# Patient Record
Sex: Female | Born: 1935 | Race: White | Hispanic: No | Marital: Married | Smoking: Former smoker
Health system: Southern US, Community
[De-identification: ages and names within clinical notes are randomized; demographics above are authoritative.]

## PROBLEM LIST (undated history)

## (undated) DIAGNOSIS — I10 Essential (primary) hypertension: Secondary | ICD-10-CM

## (undated) DIAGNOSIS — H269 Unspecified cataract: Secondary | ICD-10-CM

## (undated) DIAGNOSIS — M810 Age-related osteoporosis without current pathological fracture: Secondary | ICD-10-CM

## (undated) DIAGNOSIS — Z5189 Encounter for other specified aftercare: Secondary | ICD-10-CM

## (undated) DIAGNOSIS — E785 Hyperlipidemia, unspecified: Secondary | ICD-10-CM

## (undated) DIAGNOSIS — E039 Hypothyroidism, unspecified: Secondary | ICD-10-CM

## (undated) DIAGNOSIS — K635 Polyp of colon: Secondary | ICD-10-CM

## (undated) HISTORY — DX: Age-related osteoporosis without current pathological fracture: M81.0

## (undated) HISTORY — PX: CATARACT EXTRACTION: SUR2

## (undated) HISTORY — DX: Encounter for other specified aftercare: Z51.89

## (undated) HISTORY — DX: Hypothyroidism, unspecified: E03.9

## (undated) HISTORY — DX: Polyp of colon: K63.5

## (undated) HISTORY — PX: APPENDECTOMY: SHX54

## (undated) HISTORY — PX: TONSILLECTOMY AND ADENOIDECTOMY: SUR1326

## (undated) HISTORY — DX: Hyperlipidemia, unspecified: E78.5

## (undated) HISTORY — DX: Unspecified cataract: H26.9

## (undated) HISTORY — PX: ABDOMINAL HYSTERECTOMY: SHX81

---

## 2002-03-23 ENCOUNTER — Encounter: Admission: RE | Admit: 2002-03-23 | Discharge: 2002-03-23 | Payer: Self-pay | Admitting: Internal Medicine

## 2002-03-23 ENCOUNTER — Encounter: Payer: Self-pay | Admitting: Internal Medicine

## 2004-08-13 ENCOUNTER — Ambulatory Visit: Payer: Self-pay | Admitting: Internal Medicine

## 2004-10-09 ENCOUNTER — Encounter: Admission: RE | Admit: 2004-10-09 | Discharge: 2004-10-09 | Payer: Self-pay | Admitting: Internal Medicine

## 2005-09-25 ENCOUNTER — Ambulatory Visit: Payer: Self-pay | Admitting: Internal Medicine

## 2012-01-19 ENCOUNTER — Emergency Department (HOSPITAL_COMMUNITY): Payer: Medicare Other

## 2012-01-19 ENCOUNTER — Observation Stay (HOSPITAL_COMMUNITY)
Admission: EM | Admit: 2012-01-19 | Discharge: 2012-01-20 | Disposition: A | Discharge status: Home / Self Care | Payer: Medicare Other | Attending: Emergency Medicine | Admitting: Emergency Medicine

## 2012-01-19 ENCOUNTER — Other Ambulatory Visit: Payer: Self-pay

## 2012-01-19 ENCOUNTER — Encounter (HOSPITAL_COMMUNITY): Payer: Self-pay | Admitting: *Deleted

## 2012-01-19 DIAGNOSIS — R0602 Shortness of breath: Principal | ICD-10-CM

## 2012-01-19 DIAGNOSIS — I10 Essential (primary) hypertension: Secondary | ICD-10-CM | POA: Insufficient documentation

## 2012-01-19 HISTORY — DX: Essential (primary) hypertension: I10

## 2012-01-19 LAB — CBC
HCT: 41.5 % (ref 36.0–46.0)
Hemoglobin: 14 g/dL (ref 12.0–15.0)
MCV: 88.5 fL (ref 78.0–100.0)
Platelets: 356 10*3/uL (ref 150–400)
RBC: 4.69 MIL/uL (ref 3.87–5.11)
WBC: 8.5 10*3/uL (ref 4.0–10.5)

## 2012-01-19 LAB — CK TOTAL AND CKMB (NOT AT ARMC): Relative Index: INVALID (ref 0.0–2.5)

## 2012-01-19 LAB — PRO B NATRIURETIC PEPTIDE: Pro B Natriuretic peptide (BNP): 42.9 pg/mL (ref 0–450)

## 2012-01-19 LAB — COMPREHENSIVE METABOLIC PANEL
ALT: 12 U/L (ref 0–35)
Alkaline Phosphatase: 86 U/L (ref 39–117)
CO2: 26 mEq/L (ref 19–32)
Calcium: 10.3 mg/dL (ref 8.4–10.5)
GFR calc Af Amer: >90 mL/min (ref >90)
GFR calc non Af Amer: 80 mL/min — ABNORMAL LOW (ref >90)
Glucose, Bld: 101 mg/dL — ABNORMAL HIGH (ref 70–99)
Sodium: 138 mEq/L (ref 135–145)

## 2012-01-19 LAB — TROPONIN I: Troponin I: <0.3 ng/mL (ref <0.30)

## 2012-01-19 LAB — DIFFERENTIAL
Eosinophils Relative: 1 % (ref 0–5)
Lymphocytes Relative: 24 % (ref 12–46)
Lymphs Abs: 2.1 10*3/uL (ref 0.7–4.0)

## 2012-01-19 MED ORDER — SODIUM CHLORIDE 0.9 % IV SOLN
1000.0000 mL | Freq: Once | INTRAVENOUS | Status: AC
  Administered 2012-01-20: 1000 mL via INTRAVENOUS

## 2012-01-19 MED ORDER — SODIUM CHLORIDE 0.9 % IV SOLN: 1000.0000 mL | INTRAVENOUS | Status: DC

## 2012-01-19 NOTE — ED Notes (Signed)
Difficulty breathing for 3 months while she was traveling in [place].  She was seen earlier today  And had a chest xray.  She was also given a hhn earlier today.  No respiratory difficulty at the present time.  Productive cough  Light brown thick

## 2012-01-19 NOTE — ED Notes (Signed)
Patient Dr Krystal Hopkins [phone] son of patient.

## 2012-01-19 NOTE — ED Notes (Signed)
The pt has a son  That is a doctor and he insisted that she come here to be seen.  She had many of these same tests today earlier.  She is requesting a stress test.  She has an appoinment on Monday with her doctor she saw today

## 2012-01-19 NOTE — ED Provider Notes (Signed)
History     CSN: [id number]  Arrival date & time 01/19/12  1735   First MD Initiated Contact with Patient 01/19/12 2004      Chief Complaint  Patient presents with  . Shortness of Breath    (Consider location/radiation/quality/duration/timing/severity/associated sxs/prior treatment) HPI  78 year old female with history of hypertension presents with chief complaints of shortness of breath. Patient states for the past several months she has been having persistent chest congestion, with cough productive with white sputum. Shortness of breath has been worsening with exertion, and improves with rest. Chest congestion has been persistent without sneezing or coughing or runny nose. Patient denies fever, chills, nausea, vomiting, diarrhea, abdominal pain, dysuria. She denies chest pain. States she went to [place] during that time and was having trouble with dyspnea on exertion. States she gets out of breath, walking on stairs, and this is unusual for her. She returned back to [place] last week. States she has tried to followup with the primary care Dr. for further evaluation of her shortness of breath. She mentioned that it was and she has a chest x-ray of which were normal. She returns home, and discussed the results with her son who is a physician. As an urgent to come to the ER for further evaluation, considering that she has been on a recent long trip, and she has a family history of CHF. Patient denies hemoptysis, calf pain or leg swelling. She denies weight gain. Patient states she sleeps with one pillow at night and denies increased SOB with laying flat.    Past Medical History  Diagnosis Date  . Hypertension     History reviewed. No pertinent past surgical history.  No family history on file.  History  Substance Use Topics  . Smoking status: Never Smoker   . Smokeless tobacco: Not on file  . Alcohol Use: Yes    OB History    Grav Para Term Preterm Abortions TAB SAB Ect Mult Living               Review of Systems  All other systems reviewed and are negative.    Allergies  Fosamax  Home Medications   Current Outpatient Rx  Name Route Sig Dispense Refill  . ASPIRIN EC 81 MG PO TBEC Oral Take 81 mg by mouth every morning.    . ENALAPRIL MALEATE 10 MG PO TABS Oral Take 10 mg by mouth every morning.    Marland Kitchen LEVOTHYROXINE SODIUM 100 MCG PO TABS Oral Take 100 mcg by mouth every morning.    . ADULT MULTIVITAMIN W/MINERALS CH Oral Take 1 tablet by mouth every morning.    Marland Kitchen SIMVASTATIN 20 MG PO TABS Oral Take 20 mg by mouth at bedtime.      BP 144/55  Pulse 90  Temp(Src) 98.2 F (36.8 C) (Oral)  Resp 20  SpO2 98%  Physical Exam  Nursing note and vitals reviewed. Constitutional: She is oriented to person, place, and time. She appears well-developed and well-nourished. No distress.       Awake, alert, nontoxic appearance  HENT:  Head: Atraumatic.  Eyes: Conjunctivae are normal. Right eye exhibits no discharge. Left eye exhibits no discharge.  Neck: Neck supple.  Cardiovascular: Normal rate and regular rhythm.   Pulmonary/Chest: Effort normal. No respiratory distress. She has no wheezes. She has no rales. She exhibits no tenderness.  Abdominal: Soft. There is no tenderness. There is no rebound.  Musculoskeletal: Normal range of motion. She exhibits no edema and no tenderness.  Neurological: She is alert and oriented to person, place, and time.  Skin: Skin is warm. No rash noted.  Psychiatric: She has a normal mood and affect.    ED Course  Procedures (including critical care time)   Labs Reviewed  CBC  DIFFERENTIAL  POCT I-STAT TROPONIN I  COMPREHENSIVE METABOLIC PANEL  CK TOTAL AND CKMB   No results found.   No diagnosis found.   Date: 01/19/2012  Rate: 85   Rhythm: normal sinus rhythm  QRS Axis: left  Intervals: PR prolonged  ST/T Wave abnormalities: normal  Conduction Disutrbances:right bundle branch block  Narrative Interpretation:   Old  EKG Reviewed: none available  Results for orders placed during the hospital encounter of 01/19/12  CBC      Component Value Range   WBC 8.5  4.0 - 10.5 (K/uL)   RBC 4.69  3.87 - 5.11 (MIL/uL)   Hemoglobin 14.0  12.0 - 15.0 (g/dL)   HCT 16.1  09.6 - 04.5 (%)   MCV 88.5  78.0 - 100.0 (fL)   MCH 29.9  26.0 - 34.0 (pg)   MCHC 33.7  30.0 - 36.0 (g/dL)   RDW 40.9  81.1 - 91.4 (%)   Platelets 356  150 - 400 (K/uL)  DIFFERENTIAL      Component Value Range   Neutrophils Relative 66  43 - 77 (%)   Neutro Abs 5.6  1.7 - 7.7 (K/uL)   Lymphocytes Relative 24  12 - 46 (%)   Lymphs Abs 2.1  0.7 - 4.0 (K/uL)   Monocytes Relative 8  3 - 12 (%)   Monocytes Absolute 0.7  0.1 - 1.0 (K/uL)   Eosinophils Relative 1  0 - 5 (%)   Eosinophils Absolute 0.1  0.0 - 0.7 (K/uL)   Basophils Relative 0  0 - 1 (%)   Basophils Absolute 0.0  0.0 - 0.1 (K/uL)  COMPREHENSIVE METABOLIC PANEL      Component Value Range   Sodium 138  135 - 145 (mEq/L)   Potassium 4.5  3.5 - 5.1 (mEq/L)   Chloride 101  96 - 112 (mEq/L)   CO2 26  19 - 32 (mEq/L)   Glucose, Bld 101 (*) 70 - 99 (mg/dL)   BUN 17  6 - 23 (mg/dL)   Creatinine, Ser 7.82  0.50 - 1.10 (mg/dL)   Calcium 95.6  8.4 - 10.5 (mg/dL)   Total Protein 7.3  6.0 - 8.3 (g/dL)   Albumin 4.2  3.5 - 5.2 (g/dL)   AST 21  0 - 37 (U/L)   ALT 12  0 - 35 (U/L)   Alkaline Phosphatase 86  39 - 117 (U/L)   Total Bilirubin 0.4  0.3 - 1.2 (mg/dL)   GFR calc non Af Amer 80 (*) >90 (mL/min)   GFR calc Af Amer >90  >90 (mL/min)  CK TOTAL AND CKMB      Component Value Range   Total CK 74  7 - 177 (U/L)   CK, MB 2.7  0.3 - 4.0 (ng/mL)   Relative Index RELATIVE INDEX IS INVALID  0.0 - 2.5   POCT I-STAT TROPONIN I      Component Value Range   Troponin i, poc 0.01  0.00 - 0.08 (ng/mL)   Comment 3           D-DIMER, QUANTITATIVE      Component Value Range   D-Dimer, Quant 1.31 (*) 0.00 - 0.48 (ug/mL-FEU)  PRO B NATRIURETIC PEPTIDE  Component Value Range   Pro B  Natriuretic peptide (BNP) 42.9  0 - 450 (pg/mL)  TROPONIN I      Component Value Range   Troponin I <0.30  <0.30 (ng/mL)   Dg Chest 2 View  01/19/2012  *RADIOLOGY REPORT*  Clinical Data: Shortness of breath and congestion  CHEST - 2 VIEW  Comparison: None.  Findings: Heart size is normal. Aorta is ectatic and unfolded.  The lungs are clear.  Mildly diffusely prominent interstitial markings are noted without evidence for overt edema.  No pleural effusion. No acute osseous abnormality.  Minimal bilateral AC joint degenerative change.  IMPRESSION: No focal abnormality.  Minimally prominent interstitial markings is nonspecific.  Original Report Authenticated By: Harrel Lemon, M.D.   Ct Angio Chest W/cm &/or Wo Cm  01/19/2012  *RADIOLOGY REPORT*  Clinical Data: Recent long-distance flights; elevated D-dimer. History of pulmonary embolus.  Assess for pulmonary embolus.  CT ANGIOGRAPHY CHEST  Technique:  Multidetector CT imaging of the chest using the standard protocol during bolus administration of intravenous contrast. Multiplanar reconstructed images including MIPs were obtained and reviewed to evaluate the vascular anatomy.  Contrast:  85 mL of Omnipaque 350 IV contrast  Comparison: Chest radiograph performed earlier today at 08:04 p.m.  Findings: There is no evidence of significant pulmonary embolus.  There is a slightly mosaic pattern of lung attenuation bilaterally. Mild focal atelectasis at the right lung base reflects a focal herniation of fat across the right hemidiaphragm.  There is no evidence of significant focal consolidation, pleural effusion or pneumothorax.  No masses are identified; no abnormal focal contrast enhancement is seen.  The mediastinum is unremarkable in appearance.  No mediastinal lymphadenopathy is seen.  No pericardial effusion is identified. The great vessels are unremarkable in appearance.  No axillary lymphadenopathy is seen.  The thyroid gland is diminutive and unremarkable  in appearance.  The visualized portions of the liver and spleen are unremarkable. Multiple left renal parapelvic cysts are noted.  No acute osseous abnormalities are seen.  IMPRESSION:  1.  No evidence of significant pulmonary embolus. 2.  Slightly mosaic pattern of lung attenuation noted bilaterally; this is nonspecific, and can reflect a variety of etiologies. 3.  Mild focal atelectasis at the right lung base, reflecting a focal herniation of fat across the right hemidiaphragm. 4.  Multiple left renal parapelvic cysts noted.  Original Report Authenticated By: Tonia Ghent, M.D.      MDM  Patient is currently in no acute respiratory distress. She is stable normal vital signs. Lungs clear to auscultation bilaterally. She has no evidence of pedal edema or calf tenderness. However, she is at increased risk of having PE although the duration of her symptoms of the symptoms to support it.  I will obtain D-dimer and BNP for further evaluation.  Currently her CXR is unremarkable, along with her Hgb and electrolytes.  Normal first set of trop. ECG shows incomplete RBBB but no prior ECG for comparison.     10:09 PM Elevated D-dimer of 1.31.  Pt has normal kidney function.  Will obtain chest CTA to r/o PE.  Pt is aware of plan.      11:21 PM Normal delta troponin.  Chest CTA shows no evidence of PE. Pt's son who is a physician request for a cardiac stress test.  I discussed this with my attending, who agrees to see pt.  Plan to move to CDU for cardiac stress test in the AM.    5:46 AM  Plan for stress test in AM.  If negative, to f/u with PCP at [facility].  Pt has an uneventful night in ED and sleeping comfortably. Dr. Manus Gunning is aware of plan.    Fayrene Helper, PA-C 01/20/12 [phone]

## 2012-01-19 NOTE — ED Notes (Signed)
Patient with shortness of breath for last three months.  Patient just returned home from [place], was seen at PCP today and had full work up of shortness of breath with bloodwork.  Family member is MD and stressed for patient to come to ED to be evaluated for shortness of breath.  Patient with clear breath sounds, no distress noted.  Patient able to walk with no shortness of breath.  Patient is CAOx3.

## 2012-01-20 DIAGNOSIS — R072 Precordial pain: Secondary | ICD-10-CM

## 2012-01-20 LAB — POCT I-STAT TROPONIN I: Troponin i, poc: 0 ng/mL (ref 0.00–0.08)

## 2012-01-20 MED ORDER — SODIUM CHLORIDE 0.9 % IV SOLN: 20.0000 mL | INTRAVENOUS | Status: DC

## 2012-01-20 NOTE — ED Notes (Signed)
Pt education provided on stress test. Questions answered. Pt states she has had a cough all winter. States she does ride an exercise bike for 15 minutes daily but states she doesn't like to walk a lot because it makes her sob. Pt demonstrates coungh. Nonproductive. Has rattley cough. Pt verbalizing concern that she may have chf

## 2012-01-20 NOTE — Progress Notes (Deleted)
  Echocardiogram 2D Echocardiogram has been performed.  Krystal Hopkins L 01/20/2012, 11:09 AM

## 2012-01-20 NOTE — Discharge Instructions (Signed)
Shortness of Breath Shortness of breath (dyspnea) is the feeling of uneasy breathing. Shortness of breath does not always mean that there is a life-threatening illness. However, shortness of breath requires immediate medical care. CAUSES  Causes for shortness of breath include:  Not enough oxygen in the air (as with high altitudes or with a smoke-filled room).   Short-term (acute) lung disease, including:   Infections such as pneumonia.   Fluid in the lungs, such as heart failure.   A blood clot in the lungs (pulmonary embolism).   Lasting (chronic) lung diseases.   Heart disease (heart attack, angina, heart failure, and others).   Low red blood cells (anemia).   Poor physical fitness. This can cause shortness of breath when you exercise.   Chest or back injuries or stiffness.   Being overweight (obese).   Anxiety. This can make you feel like you are not getting enough air.  DIAGNOSIS  Serious medical problems can usually be found during your physical exam. Many tests may also be done to determine why you are having shortness of breath. Tests include:  Chest X-rays.   Lung function tests.   Blood tests.   Electrocardiography.   Exercise testing.   A cardiac echo.   Imaging scans.  Your caregiver may not be able to find a cause for your shortness of breath after your exam. In this case, it is important to have a follow-up exam with your caregiver as directed.  HOME CARE INSTRUCTIONS   Do not smoke. Smoking is a common cause of shortness of breath. Ask for help to stop smoking.   Avoid being around chemicals that may bother your breathing (paint fumes, dust).   Rest as needed. Slowly resume your usual activities.   If medicines were prescribed, take them as directed for the full length of time directed. This includes oxygen and any inhaled medicines.   Follow up with your caregiver as directed. Waiting to do so or failure to follow up could result in worsening of  your condition and possible disability or death.   Be sure you understand what to do or who to call if your shortness of breath worsens.  SEEK MEDICAL CARE IF:   Your condition does not improve in the time expected.   You have a hard time doing your normal activities even with rest.   You have any side effects or problems with the medicines prescribed.   You develop any new symptoms.  SEEK IMMEDIATE MEDICAL CARE IF:   Your shortness of breath is getting worse.   You feel lightheaded, faint, or develop a cough not controlled with medicines.   You start coughing up blood.   You have pain with breathing.   You have chest pain or pain in your arms, shoulders, or abdomen.   You have a fever.   You are unable to walk up stairs or exercise the way you normally do.   Your symptoms are getting worse.  Document Released: 06/16/2001 Document Revised: 09/10/2011 Document Reviewed: 02/01/2008 ExitCare Patient Information 2012 [employer]. 

## 2012-01-20 NOTE — Progress Notes (Signed)
  Echocardiogram Echocardiogram Stress Test has been performed.  Krystal Hopkins L 01/20/2012, 11:10 AM

## 2012-01-20 NOTE — ED Notes (Signed)
PT CONTNUES OUT OF DEPT IN [facility]

## 2012-01-20 NOTE — ED Provider Notes (Signed)
7:15 AM Assumed care of the patient in the CDU from Dr. Manus Gunning.  Patient is currently on chest pain protocol.  Plan is for patient to have Stress Echo this morning and if normal be discharged home with PCP follow up.  Patient had CTA last evening which was negative for PE. Patient is awaiting Stress Echocardiogram this morning.  Reassessed patient.  Patient reports that she is not having any chest pain or SOB at this time.  No complaints.  VSS.  Alert and orientated x 3, Heart RRR, Lungs CTAB.    8:30 Am Reassessed patient.  Discussed all of the results of the tests with the patient and her family.  Patient and family verbalize understanding.  Patient awaiting Stress Echo. No chest pain at this time.  No acute distress.  12:15 PM Reassessed patient.  Patient has now returned from Stress Echo.  She is denying any chest pain at this time.  No acute distress.  Alert and orientated, VSS, Heart RRR, Lungs CTAB.  12:45 PM Discussed results of the Echocardiogram with patient.  Patient with normal EF and normal Echocardiogram.  Will discharge patient home.  Patient instructed to follow up with PCP.  Pascal Lux Surry, PA-C 01/20/12 1552

## 2012-01-22 NOTE — ED Provider Notes (Signed)
Medical screening examination/treatment/procedure(s) were performed by non-physician practitioner and as supervising physician I was immediately available for consultation/collaboration.   Dione Booze, MD 01/22/12 [phone]

## 2012-01-22 NOTE — ED Provider Notes (Signed)
Medical screening examination/treatment/procedure(s) were performed by non-physician practitioner and as supervising physician I was immediately available for consultation/collaboration.   Martie Fulgham L Michoel Kunin, MD 01/22/12 1641 

## 2012-03-16 ENCOUNTER — Encounter: Payer: Self-pay | Admitting: Internal Medicine

## 2012-09-26 ENCOUNTER — Encounter: Payer: Self-pay | Admitting: Internal Medicine

## 2012-10-05 DIAGNOSIS — K635 Polyp of colon: Secondary | ICD-10-CM

## 2012-10-05 HISTORY — PX: COLONOSCOPY: SHX174

## 2012-10-05 HISTORY — DX: Polyp of colon: K63.5

## 2012-10-20 ENCOUNTER — Telehealth: Payer: Self-pay | Admitting: *Deleted

## 2012-10-20 NOTE — Telephone Encounter (Signed)
Dr. Leone Payor,  This pt has a PV 10-21-12 for a colonoscopy on 11-08-12.  According to the referral info sent she had occult blood in stool.  Do you want to see her first or go ahead with the colon? I'll put this information in your in box.  Thanks [employer]

## 2012-10-20 NOTE — Telephone Encounter (Signed)
Ok to go ahead

## 2012-10-24 ENCOUNTER — Ambulatory Visit (AMBULATORY_SURGERY_CENTER): Payer: Medicare Other | Admitting: *Deleted

## 2012-10-24 VITALS — Ht 63.0 in | Wt 223.2 lb

## 2012-10-24 DIAGNOSIS — R195 Other fecal abnormalities: Secondary | ICD-10-CM

## 2012-10-24 MED ORDER — NA SULFATE-K SULFATE-MG SULF 17.5-3.13-1.6 GM/177ML PO SOLN: 1.0000 | Freq: Once | ORAL | Status: DC

## 2012-10-24 NOTE — Progress Notes (Signed)
No allergy to eggs or soy products 

## 2012-10-26 ENCOUNTER — Other Ambulatory Visit: Payer: Self-pay | Admitting: Cardiology

## 2012-10-26 ENCOUNTER — Ambulatory Visit
Admission: RE | Admit: 2012-10-26 | Discharge: 2012-10-26 | Disposition: A | Discharge status: Home / Self Care | Payer: Medicare Other | Source: Ambulatory Visit | Attending: Cardiology | Admitting: Cardiology

## 2012-10-26 DIAGNOSIS — R0602 Shortness of breath: Secondary | ICD-10-CM

## 2012-11-08 ENCOUNTER — Encounter: Payer: Self-pay | Admitting: Internal Medicine

## 2012-11-08 ENCOUNTER — Ambulatory Visit (AMBULATORY_SURGERY_CENTER): Payer: Medicare Other | Admitting: Internal Medicine

## 2012-11-08 VITALS — BP 111/70 | HR 68 | Temp 97.2°F | Resp 12 | Ht 63.0 in | Wt 223.0 lb

## 2012-11-08 DIAGNOSIS — L309 Dermatitis, unspecified: Secondary | ICD-10-CM

## 2012-11-08 DIAGNOSIS — D126 Benign neoplasm of colon, unspecified: Secondary | ICD-10-CM

## 2012-11-08 DIAGNOSIS — K5730 Diverticulosis of large intestine without perforation or abscess without bleeding: Secondary | ICD-10-CM

## 2012-11-08 DIAGNOSIS — R195 Other fecal abnormalities: Secondary | ICD-10-CM

## 2012-11-08 DIAGNOSIS — K648 Other hemorrhoids: Secondary | ICD-10-CM

## 2012-11-08 MED ORDER — NYSTATIN-TRIAMCINOLONE 100000-0.1 UNIT/GM-% EX OINT: TOPICAL_OINTMENT | Freq: Two times a day (BID) | CUTANEOUS | Status: DC

## 2012-11-08 MED ORDER — SODIUM CHLORIDE 0.9 % IV SOLN: 500.0000 mL | INTRAVENOUS | Status: DC

## 2012-11-08 NOTE — Progress Notes (Signed)
No complaints noted in the recovery room. Maw  Patient did not experience any of the following events: a burn prior to discharge; a fall within the facility; wrong site/side/patient/procedure/implant event; or a hospital transfer or hospital admission upon discharge from the facility. (G8907) Patient did not have preoperative order for IV antibiotic SSI prophylaxis. (G8918)  

## 2012-11-08 NOTE — Op Note (Signed)
[facility] [address] [place], [address]   COLONOSCOPY PROCEDURE REPORT  PATIENT: Krystal Hopkins, Krystal Hopkins  MR#: [id number] BIRTHDATE: 06-10-1932 , 80  yrs. old GENDER: Female ENDOSCOPIST: Iva Boop, MD, [facility] REFERRED BY:   Zoe Lan, FNP PROCEDURE DATE:  11/08/2012 PROCEDURE:   Colonoscopy with biopsy and snare polypectomy ASA CLASS:   Class II INDICATIONS:heme-positive stool. MEDICATIONS: propofol (Diprivan) 200mg  IV, MAC sedation, administered by CRNA, and These medications were titrated to patient response per physician's verbal order  DESCRIPTION OF PROCEDURE:   After the risks benefits and alternatives of the procedure were thoroughly explained, informed consent was obtained.  A digital rectal exam revealed no abnormalities of the rectum.   The LB [id number]  endoscope was introduced through the anus and advanced to the cecum, which was identified by both the appendix and ileocecal valve. No adverse events experienced.   The quality of the prep was Suprep excellent The instrument was then slowly withdrawn as the colon was fully examined.      COLON FINDINGS: Two diminutive polypoid shaped sessile polyps were found in the transverse colon.  A polypectomy was performed with a cold snare on the 4 mm polyp and with cold forceps on the 2 mm polyp. The resection was complete and the polyp tissue was completely retrieved.   Moderate diverticulosis was noted in the sigmoid colon.   Small internal hemorrhoids were found.   A right colon retroflexion was performed.  Retroflexed views revealed internal hemorrhoids. The time to cecum=3 minutes 08 seconds. Withdrawal time=12 minutes 02 seconds.  The scope was withdrawn and the procedure completed. COMPLICATIONS: There were no complications.  ENDOSCOPIC IMPRESSION: 1.   Two diminutive sessile polyps were found in the transverse colon; polypectomy was performed with a cold snare and with  cold forceps 2.   Moderate diverticulosis was noted in the sigmoid colon 3.   Small internal hemorrhoids   RECOMMENDATIONS: follow-up as needed most likely Nystatin-triamcinolone for dermatitis  eSigned:  Iva Boop, MD, [facility] 11/08/2012 10:15 AM   cc: The Patient    and Zoe Lan, FNP (with Dr. Everlene Other)

## 2012-11-08 NOTE — Patient Instructions (Addendum)
Two tiny polyps were removed. They look benign but I will find out and let you know.  You also have diverticulosis and internal hemorrhoids (these probably caused the + test for blood in the stool). Neither of these are usually a significant problem.  I do not think I will be recommending a routine repeat colonoscopy but will send a letter to let you know.  I saw a rash on your skin of the buttocks and perianal region and have prescribed an ointment that should fix that.  After it is gone using anti-fungal powder should help keep the rash away   Thank you for choosing me and [facility].  Iva Boop, MD, [facility]     Handouts were given to your care partner on polyps, diverticulosis and hemorrhoids.  You may resume your current medications today.  Please call if any questions or concerns.    YOU HAD AN ENDOSCOPIC PROCEDURE TODAY AT THE [facility]: Refer to the procedure report that was given to you for any specific questions about what was found during the examination.  If the procedure report does not answer your questions, please call your gastroenterologist to clarify.  If you requested that your care partner not be given the details of your procedure findings, then the procedure report has been included in a sealed envelope for you to review at your convenience later.  YOU SHOULD EXPECT: Some feelings of bloating in the abdomen. Passage of more gas than usual.  Walking can help get rid of the air that was put into your GI tract during the procedure and reduce the bloating. If you had a lower endoscopy (such as a colonoscopy or flexible sigmoidoscopy) you may notice spotting of blood in your stool or on the toilet paper. If you underwent a bowel prep for your procedure, then you may not have a normal bowel movement for a few days.  DIET: Your first meal following the procedure should be a light meal and then it is ok to progress to your normal diet.  A  half-sandwich or bowl of soup is an example of a good first meal.  Heavy or fried foods are harder to digest and may make you feel nauseous or bloated.  Likewise meals heavy in dairy and vegetables can cause extra gas to form and this can also increase the bloating.  Drink plenty of fluids but you should avoid alcoholic beverages for 24 hours.  ACTIVITY: Your care partner should take you home directly after the procedure.  You should plan to take it easy, moving slowly for the rest of the day.  You can resume normal activity the day after the procedure however you should NOT DRIVE or use heavy machinery for 24 hours (because of the sedation medicines used during the test).    SYMPTOMS TO REPORT IMMEDIATELY: A gastroenterologist can be reached at any hour.  During normal business hours, 8:30 AM to 5:00 PM Monday through Friday, call [phone].  After hours and on weekends, please call the GI answering service at [phone] who will take a message and have the physician on call contact you.   Following lower endoscopy (colonoscopy or flexible sigmoidoscopy):  Excessive amounts of blood in the stool  Significant tenderness or worsening of abdominal pains  Swelling of the abdomen that is new, acute  Fever of 100F or higher    FOLLOW UP: If any biopsies were taken you will be contacted by phone or by letter within the  next 1-3 weeks.  Call your gastroenterologist if you have not heard about the biopsies in 3 weeks.  Our staff will call the home number listed on your records the next business day following your procedure to check on you and address any questions or concerns that you may have at that time regarding the information given to you following your procedure. This is a courtesy call and so if there is no answer at the home number and we have not heard from you through the emergency physician on call, we will assume that you have returned to your regular daily activities without  incident.  SIGNATURES/CONFIDENTIALITY: You and/or your care partner have signed paperwork which will be entered into your electronic medical record.  These signatures attest to the fact that that the information above on your After Visit Summary has been reviewed and is understood.  Full responsibility of the confidentiality of this discharge information lies with you and/or your care-partner.

## 2012-11-08 NOTE — Progress Notes (Signed)
Called to room to assist during endoscopic procedure.  Patient ID and intended procedure confirmed with present staff. Received instructions for my participation in the procedure from the performing physician.  

## 2012-11-09 ENCOUNTER — Telehealth: Payer: Self-pay | Admitting: *Deleted

## 2012-11-09 NOTE — Telephone Encounter (Signed)
  Follow up Call-  Call back number 11/08/2012  Post procedure Call Back phone  # [phone]  Permission to leave phone message Yes     Patient questions:  Do you have a fever, pain , or abdominal swelling? no Pain Score  0 *  Have you tolerated food without any problems? yes  Have you been able to return to your normal activities? yes  Do you have any questions about your discharge instructions: Diet   no Medications  no Follow up visit  no  Do you have questions or concerns about your Care? no  Actions: * If pain score is 4 or above: No action needed, pain <4.

## 2012-11-14 ENCOUNTER — Encounter: Payer: Self-pay | Admitting: Internal Medicine

## 2012-12-06 ENCOUNTER — Institutional Professional Consult (permissible substitution): Payer: Medicare Other | Admitting: Internal Medicine

## 2012-12-27 ENCOUNTER — Ambulatory Visit (INDEPENDENT_AMBULATORY_CARE_PROVIDER_SITE_OTHER): Payer: Medicare Other | Admitting: Internal Medicine

## 2012-12-27 ENCOUNTER — Encounter: Payer: Self-pay | Admitting: Internal Medicine

## 2012-12-27 VITALS — BP 104/62 | HR 69 | Temp 98.1°F | Ht 64.0 in | Wt 228.2 lb

## 2012-12-27 DIAGNOSIS — R059 Cough, unspecified: Secondary | ICD-10-CM

## 2012-12-27 DIAGNOSIS — R0600 Dyspnea, unspecified: Secondary | ICD-10-CM

## 2012-12-27 DIAGNOSIS — R05 Cough: Secondary | ICD-10-CM

## 2012-12-27 DIAGNOSIS — R0609 Other forms of dyspnea: Secondary | ICD-10-CM

## 2012-12-27 DIAGNOSIS — J849 Interstitial pulmonary disease, unspecified: Secondary | ICD-10-CM

## 2012-12-27 NOTE — Progress Notes (Signed)
Subjective:    Patient ID: Krystal Hopkins, female    DOB: 1932/09/27, 79 y.o.   MRN: [id number] pcp is Iona Hansen, NP  HPI REferred by Dr Jeanella Cara for dyspnea  IOV 12/27/2012  79 year old female. Reports dyspnea. Says "I cannot catch my breath". Every spring she goes to [place] with daughter but after last spring visit, developed symptoms. Last spring she was in [place] and [place]: and noticed exertional dyspnea with hill walking. AFter that dyspneic is episodic and is unrelated to exertion.Says dyspnea is episodic. Episodes have happened only 4 times (once after seeing cardiology). Definitely unrelated to exertion. Episodes occur suddenly without warning. Of these 4, 2 happened in Dr Zoe Lan office and in both cough preceded dyspnea. Other 2 times happened again in day time. All without warning. She demonstrates to me a choking kind of whoop that is making her dyspneic. Lasts several seconds.   Otherwise, denies wheeze, Patient reports there is concern for ILD on CXR and therefore referred here. Patient wonders if her obesity (BMI almost 51) and age is making her dyspneic but wonders why this is not related to exertion. Exam shows crackles at the base. Walking desaturation test 185 feet x3 laps did not desaturate    Dyspnea relevant history  - Former 20 pack smoker. Quit 1972  - Imaging : 01/19/2012: CT angiogram of the chest was negative for pulmonary embolism but showed monocytes at an ablation of the chest with right lower lobe focal opacity was subtle and was thought to represent a fat herniation to the right hemidiaphragm. 10/26/2012: Chest x-ray showed stable lung findings with some bronchitis changes  - Near morbid obesity : Body mass index is 39.15 kg/(m^2). - obesity  - Echocardiogram 11/02/2012: According to the outside report. She had normal systolic global function with an ejection fraction of 56%. The left atrial cavity was slightly dilated and she had trace mitral and  tricuspid regurgitation with borderline elevated pulmonary artery systolic pressure.  - Nuclear medicine stress test 10/28/2012: EKG showed poor R-wave progression but stress EKG was nondiagnostic for ischemia. Dynamic gaited images revealed normal wall motion. The left ventricle ejection fraction was 71%. This was considered a low-risk study   Chronic cough  She reports mild cough x 1 year. Describes it as just  irritating. Is so mild that she would not have come here for opinion. Not getting worse; stable. Dry cough. All day. Does not wake her up but worst when she first gets up; has to clear throat a lot. DEnies associated sinus draingae, gerd, Rates cough as a level 2 of 10.    Dr Gretta Cool Reflux Symptom Index (> 13-15 suggestive of LPR cough) 0 -> 5  =  none ->severe problem 12/27/2012   Hoarseness of problem with voice 1  Clearing  Of Throat 3  Excess throat mucus or feeling of post nasal drip 2  Difficulty swallowing food, liquid or tablets 1  Cough after eating or lying down 0  Breathing difficulties or choking episodes 0  Troublesome or annoying cough 2  Sensation of something sticking in throat or lump in throat 3  Heartburn, chest pain, indigestion, or stomach acid coming up 0  TOTAL 12     No reportss  Of orthopnea, gerd, hemoptysis, edema (other than baseline mild), paroxysmal nocturnal dyspnea. QRS and a year ago look for blood clot in the lung that      Past Medical History  Diagnosis Date  . Hypertension   .  Blood transfusion without reported diagnosis   . Cataract   . Hyperlipidemia   . Osteoporosis   . Thyroid disease     hypo     Family History  Problem Relation Age of Onset  . Colon cancer Neg Hx   . Esophageal cancer Neg Hx   . Rectal cancer Neg Hx   . Stomach cancer Neg Hx   . Heart disease Father   . Heart disease Mother   . Heart disease      siblings     History   Social History  . Marital Status: Married    Spouse Name: N/A     Number of Children: N/A  . Years of Education: N/A   Occupational History  . Not on file.   Social History Main Topics  . Smoking status: Former Smoker -- 1.00 packs/day for 20 years    Types: Cigarettes    Quit date: 10/24/1970  . Smokeless tobacco: Not on file  . Alcohol Use: Yes     Comment: socially  . Drug Use: No  . Sexually Active: Not on file   Other Topics Concern  . Not on file   Social History Narrative  . No narrative on file     Allergies  Allergen Reactions  . Fosamax (Alendronate Sodium) Other (See Comments)    "muscle aches"     Outpatient Prescriptions Prior to Visit  Medication Sig Dispense Refill  . aspirin EC 81 MG tablet Take 81 mg by mouth every morning.      Marland Kitchen BENICAR HCT 20-12.5 MG per tablet Take 1 tablet by mouth daily.       Marland Kitchen levothyroxine (SYNTHROID, LEVOTHROID) 100 MCG tablet Take 100 mcg by mouth every morning.      . Multiple Vitamin (MULITIVITAMIN WITH MINERALS) TABS Take 1 tablet by mouth every morning.      . nystatin-triamcinolone ointment (MYCOLOG) Apply topically 2 (two) times daily.  30 g  1  . pravastatin (PRAVACHOL) 20 MG tablet Take 20 mg by mouth daily.       Marland Kitchen olmesartan (BENICAR) 20 MG tablet Take 20 mg by mouth daily.      . simvastatin (ZOCOR) 20 MG tablet Take 20 mg by mouth at bedtime.       No facility-administered medications prior to visit.     Review of Systems  Constitutional: Negative for fever and unexpected weight change.  HENT: Negative for ear pain, nosebleeds, congestion, sore throat, rhinorrhea, sneezing, trouble swallowing, dental problem, postnasal drip and sinus pressure.   Eyes: Negative for redness and itching.  Respiratory: Positive for cough and shortness of breath. Negative for chest tightness and wheezing.   Cardiovascular: Negative for palpitations and leg swelling.  Gastrointestinal: Negative for nausea and vomiting.  Genitourinary: Negative for dysuria.  Musculoskeletal: Negative for joint  swelling.  Skin: Negative for rash.  Neurological: Negative for headaches.  Hematological: Does not bruise/bleed easily.  Psychiatric/Behavioral: Negative for dysphoric mood. The patient is not nervous/anxious.        Objective:   Physical Exam  Vitals reviewed. Constitutional: She is oriented to person, place, and time. She appears well-developed and well-nourished. No distress.  HENT:  Head: Normocephalic and atraumatic.  Right Ear: External ear normal.  Left Ear: External ear normal.  Mouth/Throat: Oropharynx is clear and moist. No oropharyngeal exudate.  Mild post nasal drainage +  Eyes: Conjunctivae and EOM are normal. Pupils are equal, round, and reactive to light. Right eye exhibits no discharge.  Left eye exhibits no discharge. No scleral icterus.  Neck: Normal range of motion. Neck supple. No JVD present. No tracheal deviation present. No thyromegaly present.  Cardiovascular: Normal rate, regular rhythm, normal heart sounds and intact distal pulses.  Exam reveals no gallop and no friction rub.   No murmur heard. Pulmonary/Chest: Effort normal. No respiratory distress. She has no wheezes. She has rales. She exhibits no tenderness.  Rt base worse than left base but present bilaterally  Abdominal: Soft. Bowel sounds are normal. She exhibits no distension and no mass. There is no tenderness. There is no rebound and no guarding.  Musculoskeletal: Normal range of motion. She exhibits no edema and no tenderness.  Lymphadenopathy:    She has no cervical adenopathy.  Neurological: She is alert and oriented to person, place, and time. She has normal reflexes. No cranial nerve deficit. She exhibits normal muscle tone. Coordination normal.  Mild antalgic gait  Skin: Skin is warm and dry. No rash noted. She is not diaphoretic. No erythema. No pallor.  Psychiatric: She has a normal mood and affect. Her behavior is normal. Judgment and thought content normal.          Assessment &  Plan:

## 2012-12-27 NOTE — Patient Instructions (Addendum)
I am not completely sure what is going on Please have CT scan of the chest to look for interstitial lung disease Please have pulmonary function test Return to see me after the above Please complete all the above in the next few weeks

## 2012-12-30 ENCOUNTER — Telehealth: Payer: Self-pay | Admitting: Internal Medicine

## 2012-12-30 ENCOUNTER — Ambulatory Visit (INDEPENDENT_AMBULATORY_CARE_PROVIDER_SITE_OTHER)
Admission: RE | Admit: 2012-12-30 | Discharge: 2012-12-30 | Disposition: A | Discharge status: Home / Self Care | Payer: Medicare Other | Source: Ambulatory Visit | Attending: Internal Medicine | Admitting: Internal Medicine

## 2012-12-30 DIAGNOSIS — R059 Cough, unspecified: Secondary | ICD-10-CM | POA: Insufficient documentation

## 2012-12-30 DIAGNOSIS — R0600 Dyspnea, unspecified: Secondary | ICD-10-CM

## 2012-12-30 DIAGNOSIS — R05 Cough: Secondary | ICD-10-CM | POA: Insufficient documentation

## 2012-12-30 DIAGNOSIS — J8410 Pulmonary fibrosis, unspecified: Secondary | ICD-10-CM

## 2012-12-30 DIAGNOSIS — R0609 Other forms of dyspnea: Secondary | ICD-10-CM

## 2012-12-30 DIAGNOSIS — J849 Interstitial pulmonary disease, unspecified: Secondary | ICD-10-CM

## 2012-12-30 DIAGNOSIS — R0989 Other specified symptoms and signs involving the circulatory and respiratory systems: Secondary | ICD-10-CM

## 2012-12-30 NOTE — Telephone Encounter (Signed)
Triage,  Please call ct by 11am 12/30/2012. Ensure is CT chest ILD protocol withut contrast. I also need prone and supine images. And, I prefer read is by DR Entrikin or Dr Fredirick Lathe  Thanks  MR

## 2012-12-30 NOTE — Assessment & Plan Note (Signed)
?   ILD related, ? Sinus drainge related. Get pft and ct chest and then decide

## 2012-12-30 NOTE — Telephone Encounter (Signed)
Called, spoke with [facility] with [facility]. Informed her of below. Per Rose, order is in for CT Chest ILD protocol without contrast. She will inform tech that MR would also like prone and supine images. She will make note in comments section that MR would like Dr. Llana Aliment or Dr. Fredirick Lathe to read but states this may not happen.  Per Rose, there is a chance someone else will pick it up to read.

## 2012-12-30 NOTE — Assessment & Plan Note (Signed)
Dyspnea, cough, crackles could all be ILD related but dyspnea is non-exertional and you can also have crack;es from obesity and atelectasis with cough due to sinus drainage. Not sure what is going on. Best to get PFT and CT chest ILD protocol and assess. IF these are negatvife, will have to consider CPST

## 2013-01-01 ENCOUNTER — Telehealth: Payer: Self-pay | Admitting: Internal Medicine

## 2013-01-01 NOTE — Telephone Encounter (Signed)
Ct chest is normal. Please let her know. However, I want her to have the PFTs earlier like next week any location. I will review that and if normal, might have her do CPST. Her current fu of PFT and ov is on 02/01/13 is too far out   Ct Chest Wo Contrast  12/30/2012  *RADIOLOGY REPORT*  Clinical Data: Cough, shortness of breath, interstitial lung disease.  CT CHEST WITHOUT CONTRAST  Technique:  Multidetector CT imaging of the chest was performed following the standard protocol without IV contrast.  Comparison: 01/19/2012.  Findings: No pathologically enlarged mediastinal, hilar or axillary lymph nodes.  Coronary artery calcification.  Heart size normal. No pericardial effusion.  Minimal scarring is seen in the right lower lobe, adjacent to a small Bochdalek hernia.  High resolution and prone images show no subpleural reticulation, traction bronchiectasis/bronchiolectasis, architectural distortion or honeycombing.  Lungs are otherwise clear.  No pleural fluid.  Airway is unremarkable.  Incidental imaging of the upper abdomen shows no acute findings. No worrisome lytic or sclerotic lesions.  Degenerative changes are seen in the spine.  IMPRESSION: No evidence of interstitial lung disease.  No findings to explain the patient's cough and shortness of breath.   Original Report Authenticated By: Leanna Battles, M.D.

## 2013-01-05 NOTE — Telephone Encounter (Signed)
Pt aware of CT results. I have LMTCBx1 with Marcelino Duster at [facility] to schedule PFT. Pt states she cannot do PFT this week but anytime next week will work for her. I have cancelled PFT for 02-01-13. Carron Curie, CMA

## 2013-01-05 NOTE — Telephone Encounter (Signed)
I spoke with Marcelino Duster and scheduled the pt PFT for Tuesday 01-10-13 at 10am at [facility]. Pt is aware. Carron Curie, CMA

## 2013-01-10 ENCOUNTER — Ambulatory Visit (HOSPITAL_COMMUNITY)
Admission: RE | Admit: 2013-01-10 | Discharge: 2013-01-10 | Disposition: A | Discharge status: Home / Self Care | Payer: Medicare Other | Source: Ambulatory Visit | Attending: Internal Medicine | Admitting: Internal Medicine

## 2013-01-10 DIAGNOSIS — R059 Cough, unspecified: Secondary | ICD-10-CM

## 2013-01-10 DIAGNOSIS — R05 Cough: Secondary | ICD-10-CM | POA: Insufficient documentation

## 2013-01-10 DIAGNOSIS — R0600 Dyspnea, unspecified: Secondary | ICD-10-CM

## 2013-01-10 DIAGNOSIS — J849 Interstitial pulmonary disease, unspecified: Secondary | ICD-10-CM

## 2013-01-10 DIAGNOSIS — J8410 Pulmonary fibrosis, unspecified: Secondary | ICD-10-CM | POA: Insufficient documentation

## 2013-01-10 DIAGNOSIS — R0989 Other specified symptoms and signs involving the circulatory and respiratory systems: Secondary | ICD-10-CM | POA: Insufficient documentation

## 2013-01-10 DIAGNOSIS — R0609 Other forms of dyspnea: Secondary | ICD-10-CM | POA: Insufficient documentation

## 2013-01-10 LAB — PULMONARY FUNCTION TEST

## 2013-01-10 MED ORDER — ALBUTEROL SULFATE (5 MG/ML) 0.5% IN NEBU
2.5000 mg | INHALATION_SOLUTION | Freq: Once | RESPIRATORY_TRACT | Status: AC
  Administered 2013-01-10: 2.5 mg via RESPIRATORY_TRACT

## 2013-01-15 ENCOUNTER — Telehealth: Payer: Self-pay | Admitting: Internal Medicine

## 2013-01-15 DIAGNOSIS — R0600 Dyspnea, unspecified: Secondary | ICD-10-CM

## 2013-01-15 NOTE — Telephone Encounter (Signed)
Pulmonary function test 01/10/2013 suggest possibility of asthma but not clear-cut. In fact no clear-cut signal as to why short of breath. She should proceed with cardiopulmonary stress test prior to next visit. Please let to not test result , order CPST with exercise challenge bronchospasm and schedule office visit after CPE ST   Thanks  Dr. Kalman Shan, M.D., [facility].C.P Pulmonary and Critical Care Medicine Staff Physician [facility] Pager: [phone], If no answer or between  15:00h - 7:00h: call [phone]  01/15/2013 2:54 PM     Function test 01/10/2013: FVC 1.5 L/64%. FEV1 1.1 L/64% FEV1 22% bronchodilator response. Ratio is normal at 74/100%. Small airways 0.9 L/60% and normal. Spirometry suggests restriction but total lung capacity is 4.3 L/90% and normal. DLCO is 18.5/85% and normal

## 2013-01-18 NOTE — Telephone Encounter (Signed)
LMTCBx1. Order has not been placed yet. Waiting to speak to pt. Carron Curie, CMA

## 2013-01-19 NOTE — Telephone Encounter (Signed)
Called spoke with patient  Advised PFT results / recs as stated by MR below Pt okay with these recs and verbalized her understanding Pt already has ov w/ MR scheduled for 4.30.14 Will see if CPST can be scheduled prior to this appt; if not, will push ov out Order placed Will sign and forward to MR as [facility]

## 2013-01-19 NOTE — Telephone Encounter (Signed)
Pt returned Jennifer's call & can be reached at [phone].

## 2013-01-26 ENCOUNTER — Ambulatory Visit (HOSPITAL_COMMUNITY): Payer: Medicare Other | Attending: Internal Medicine

## 2013-01-26 DIAGNOSIS — R0602 Shortness of breath: Secondary | ICD-10-CM | POA: Insufficient documentation

## 2013-01-26 DIAGNOSIS — R0600 Dyspnea, unspecified: Secondary | ICD-10-CM

## 2013-02-01 ENCOUNTER — Ambulatory Visit: Payer: Medicare Other | Admitting: Internal Medicine

## 2013-02-10 DIAGNOSIS — R0602 Shortness of breath: Secondary | ICD-10-CM

## 2013-02-13 ENCOUNTER — Ambulatory Visit (INDEPENDENT_AMBULATORY_CARE_PROVIDER_SITE_OTHER): Payer: Medicare Other | Admitting: Internal Medicine

## 2013-02-13 ENCOUNTER — Encounter: Payer: Self-pay | Admitting: Internal Medicine

## 2013-02-13 VITALS — BP 108/68 | HR 95 | Temp 98.1°F | Ht 68.0 in | Wt 277.8 lb

## 2013-02-13 DIAGNOSIS — I519 Heart disease, unspecified: Secondary | ICD-10-CM

## 2013-02-13 DIAGNOSIS — R0989 Other specified symptoms and signs involving the circulatory and respiratory systems: Secondary | ICD-10-CM

## 2013-02-13 DIAGNOSIS — R0600 Dyspnea, unspecified: Secondary | ICD-10-CM

## 2013-02-13 DIAGNOSIS — I5189 Other ill-defined heart diseases: Secondary | ICD-10-CM

## 2013-02-13 DIAGNOSIS — R0609 Other forms of dyspnea: Secondary | ICD-10-CM

## 2013-02-13 NOTE — Patient Instructions (Addendum)
Your shortness of breath is due to weight and also due to effects of weight and bp on your heart called diastolic dysfunction  In order to get better  - start pulmonary rehabilitation  - lose weight  Followup   - 2months to discuss weight loss   To lose weight  #WEight Management    - we discussed extensively about weight management   - follow low glycemic diet plan that I outlined for you after extensive discussion. Do not follow other plans  - General  - drink lot of water  - avoid all moderate and high glycemic foods especially bad fruits, breads, pastas, fried foods, battered foods, sugary foods  - make non-starchy vegetables your base in terms of volume you eat  - always make sure you balance good carbs, good protein and good fat source  - good carbs are non-starchy vegetables, uncanned beans in the left column and low glycemic fruits in the left colum  - good protein source is egg white, beans, tofu, fish, chicken breast, fish, [place] and bison. Remember meat has to be skinless  - good healthy fat source is nuts, and fish   - focusing on eating right healthy foods (left lane) and avoiding unhealthy foods (middle and right lane) is better way to lose weight than to go hypo-caloric  - focus on staying full by eating right  - having a daily and weekly plan for what you will eat and where you will eat depending on your work, social life schedule is very important   - watch out for misleading labels on grocery aisle: High Fiber and Low Fat labeled foods generally are high in bad carbs or sugar  - measure weight once  a week  - discipline and attitude is key. Do not care for anyone else's opinion or feelings. Only yours matters   - For breakfast  - most important meal of the day. So, eat daily breakfast. Do not skip   - recommend 1/2 to 1 cup steel cut oat meal or 1/2 to 1 cup fiber one 60 cal   Or  1 to 1.5 cups Kashi go-lean with non-fat plain milk or 60 Cal Silk Soy mild. Can add  Berries. Can have egg at same time for breakfast  - For snacks  - recommend total 2-3 snacks per day  - snack should be light and filling  - best times are between breakfast and lunch, lunch and dinner and sometimes post-dinner snack  - Nut are great snacks. Stick to low glycemic nuts (less than 50gm per day) and eat only the nuts in the left lane like peanuts, pista, almond, walnut  - Low glycemic fruits are great snacks. Have 1-2 servings each day of fruits from the left lane   - If you like yogurt or cottage cheese - recommend Oikos or Fage 0% greek yogourt or Plan non-fat yogurt or [place]stone non-fat cottage cheese. Theyse have the least sugar. Do no exceed 100-200 gram per day. Fruit yogurts are the worst  - For Lunch and dinner  - unlimited non-starchy vegetable (prefer raw fresh or roasted or grilled) with skinless chicken or fish  - Special Notes  - Nuts: Nut are great snacks and have heart benefits. Stick to low glycemic nuts (less than 50gm per day) and eat only the nuts in the left lane like peanuts, pista, almond, walnuts  -  Ok to eat above nuts daily but only < 50gm/day  - If you eat more  than 50gm/day then you run risk of eating too many calories or saturated fat  - AVoid nuts glazed with sugar. Nuts have to be in salted/original form or roasted   - Fruits: Eat 1-2 fresh fruit servings daily but fruits can be dangerous because of high sugar content. So, choose your fruits wisely. Eat only the low glycemic fruits (left lane). Eat them fresh.  Do not eat them canned  - Avoid all fresh juices except if you use the low glycemic fruits and make them yourself without adding extra sugar   - Dairy: Is optional. Eat zero fat or low fat, fruit free yogurts or cottage cheese but not more than 100-200g per day  - Restaurant  - all restaurants have bad and good choices. Even fast food restaurants offer you good choices  - at restaurants do no fall prey to social pressure.. One way to eat  healthy at restaurant is to eat healthy snack or light healthy meal before you go to restaurant so that will prevent your cravings  - Restaurants with worst choices: [place] (except [employer], or [employer]), [place], [place]. At these restaurants avoid the bread, curry, fried and battered foods and chips  - Restaurants with best choices: greek, [place], [place], [place] (again here avoid bread, deep fried stuffed and pasta)  - Restaurants with Ok choice: McDonald's, [employer], [employer] (again here avoid the bread, fried stuff, fried meat)  - Always ask for grilled meat or vegetables, and fresh salad choices (get your salad dressing as low fat and to the side)

## 2013-02-13 NOTE — Progress Notes (Signed)
Subjective:    Patient ID: Krystal Hopkins, female    DOB: Nov 09, 1931, 79 y.o.   MRN: [id number] PCP Iona Hansen, NP  HPI REferred by Dr Jeanella Cara for dyspnea  IOV 12/27/2012  79 year old female. Reports dyspnea. Says "I cannot catch my breath". Every spring she goes to [place] with daughter but after last spring visit, developed symptoms. Last spring she was in [place] and [place]: and noticed exertional dyspnea with hill walking. AFter that dyspneic is episodic and is unrelated to exertion.Says dyspnea is episodic. Episodes have happened only 4 times (once after seeing cardiology). Definitely unrelated to exertion. Episodes occur suddenly without warning. Of these 4, 2 happened in Dr Zoe Lan office and in both cough preceded dyspnea. Other 2 times happened again in day time. All without warning. She demonstrates to me a choking kind of whoop that is making her dyspneic. Lasts several seconds.   Otherwise, denies wheeze, Patient reports there is concern for ILD on CXR and therefore referred here. Patient wonders if her obesity (BMI almost 20) and age is making her dyspneic but wonders why this is not related to exertion. Exam shows crackles at the base. Walking desaturation test 185 feet x3 laps did not desaturate    Dyspnea relevant history  - Former 20 pack smoker. Quit 1972  - Imaging : 01/19/2012: CT angiogram of the chest was negative for pulmonary embolism but showed monocytes at an ablation of the chest with right lower lobe focal opacity was subtle and was thought to represent a fat herniation to the right hemidiaphragm. 10/26/2012: Chest x-ray showed stable lung findings with some bronchitis changes  - Near morbid obesity : Body mass index is 39.15 kg/(m^2). - obesity  - Echocardiogram 11/02/2012: According to the outside report. She had normal systolic global function with an ejection fraction of 56%. The left atrial cavity was slightly dilated and she had trace mitral and  tricuspid regurgitation with borderline elevated pulmonary artery systolic pressure.  - Nuclear medicine stress test 10/28/2012: EKG showed poor R-wave progression but stress EKG was nondiagnostic for ischemia. Dynamic gaited images revealed normal wall motion. The left ventricle ejection fraction was 71%. This was considered a low-risk study   Chronic cough  She reports mild cough x 1 year. Describes it as just  irritating. Is so mild that she would not have come here for opinion. Not getting worse; stable. Dry cough. All day. Does not wake her up but worst when she first gets up; has to clear throat a lot. DEnies associated sinus draingae, gerd, Rates cough as a level 2 of 10.    Dr Gretta Cool Reflux Symptom Index (> 13-15 suggestive of LPR cough) 0 -> 5  =  none ->severe problem 12/27/2012   Hoarseness of problem with voice 1  Clearing  Of Throat 3  Excess throat mucus or feeling of post nasal drip 2  Difficulty swallowing food, liquid or tablets 1  Cough after eating or lying down 0  Breathing difficulties or choking episodes 0  Troublesome or annoying cough 2  Sensation of something sticking in throat or lump in throat 3  Heartburn, chest pain, indigestion, or stomach acid coming up 0  TOTAL 12     No reportss  Of orthopnea, gerd, hemoptysis, edema (other than baseline mild), paroxysmal nocturnal dyspnea. QRS and a year ago look for blood clot in the lung that   I am not completely sure what is going on  Please have CT  scan of the chest to look for interstitial lung disease  Please have pulmonary function test  Return to see me after the above  Please complete all the above in the next few weeks    OV 02/13/2013  CT chest 12/30/12 - normal   PFT 01/10/13 -  Function test 01/10/2013: FVC 1.5 L/64%. FEV1 1.1 L/64% FEV1 22% bronchodilator response. Ratio is normal at 74/100%. Small airways 0.9 L/60% and normal. Spirometry suggests restriction but total lung capacity is 4.3 L/90%  and normal. DLCO is 18.5/85% and normal  CPST 01/26/13   CPX: The RER of 1.06 indicates a near maximal effort. The peak VO2 is normal at 11.1 ml/kg/min (91% of the age/gender/weight matched sedentary norm). When adjusted to the patients ideal body weight of 139 lb (62.9 kg) the peak VO2 is 16 ml/kg (ibw)/min (112% of the IBW adjusted predicted). The improvement from low-normal to high-normal when corrected for IBW suggests obesity likely playing a role in dyspnea. The VO2 at the ventilatory threshold is normal at 67% of the predicted peak VO2. At peak exercise the ventilation was 45% of the measured MVV indicating ventilatory reserves remained (respiratory rate and Vt/IC [58%] were below the expected range and PETCO2 was mildly elevated at peak exercise). There was a borderline low HR response to the exercise with a HR reserve of 19 bpm. The O2pulse (a surrogate for stroke volume) increased very slowly throughout the exercise reaching 9 ml/beat (100% of predicted) and was flat for the last 1/3 of peak exercise suggesting diastolic dysfunction. The VE/VCO2 slope is normal. The oxygen uptake efficiency slope (OUES) is normal and reflects the patient's measured functional capacity.  Conclusion: Exercise testing with gas exchange demonstrates a normal functional capacity when compared to matched sedentary norms. There does not appear to be a clear ventilatory limitations. There appears to be mild circulatory limitation in terms of diastolic dysfunction. IN addition her body habitus, deconditioning, and age-related decline may all be contributing to her exercise intolerance.   Test completed and report prepared by: Almedia Balls, MEd, ACSM-RCEP Sr. Exercise Physiologist 01/26/2013 3:47 PM   Signed and Confirmed By:  Dr. Kalman Shan, M.D., [facility].C.P Pulmonary and Critical Care Medicine Staff Physician [facility] Pager: [phone], If no answer or between 15:00h - 7:00h: call [phone]  02/10/2013 4:53 AM            Review of Systems  Constitutional: Negative for fever and unexpected weight change.  HENT: Negative for ear pain, nosebleeds, congestion, sore throat, rhinorrhea, sneezing, trouble swallowing, dental problem, postnasal drip and sinus pressure.   Eyes: Negative for redness and itching.  Respiratory: Negative for cough, chest tightness, shortness of breath and wheezing.   Cardiovascular: Negative for palpitations and leg swelling.  Gastrointestinal: Negative for nausea and vomiting.  Genitourinary: Negative for dysuria.  Musculoskeletal: Negative for joint swelling.  Skin: Negative for rash.  Neurological: Negative for headaches.  Hematological: Does not bruise/bleed easily.  Psychiatric/Behavioral: Negative for dysphoric mood. The patient is not nervous/anxious.        Objective:   Physical Exam Vitals reviewed. Discussion onlh visit         Assessment & Plan:

## 2013-02-13 NOTE — Assessment & Plan Note (Signed)
Your shortness of breath is due to weight and also due to effects of weight and bp on your heart called diastolic dysfunction  In order to get better  - start pulmonary rehabilitation  - lose weight  Followup   - 2months to discuss weight loss  > 50% of this > 25 min visit spent in face to face counseling (15 min visit converted to 25 min)

## 2013-04-11 ENCOUNTER — Ambulatory Visit: Payer: Medicare Other | Admitting: Internal Medicine

## 2013-04-25 IMAGING — CT CT ANGIO CHEST
4 of 6 series · 14 of 30 positions shown · IV contrast (omnipaque)
Comparison: Chest radiograph performed earlier today at [DATE] p.m.

CLINICAL DATA: Recent long-distance flights; elevated D-dimer.
History of pulmonary embolus.  Assess for pulmonary embolus.

CT ANGIOGRAPHY CHEST
TECHNIQUE: Multidetector CT imaging of the chest using the
standard protocol during bolus administration of intravenous
contrast. Multiplanar reconstructed images including MIPs were
obtained and reviewed to evaluate the vascular anatomy.
Contrast:  85 mL of Omnipaque 350 IV contrast

[Series 2: pe · axial · 0.84mm/px · z∈[-149,-119]mm · 2 of 112 slices shown]
[im 56/112  lung]
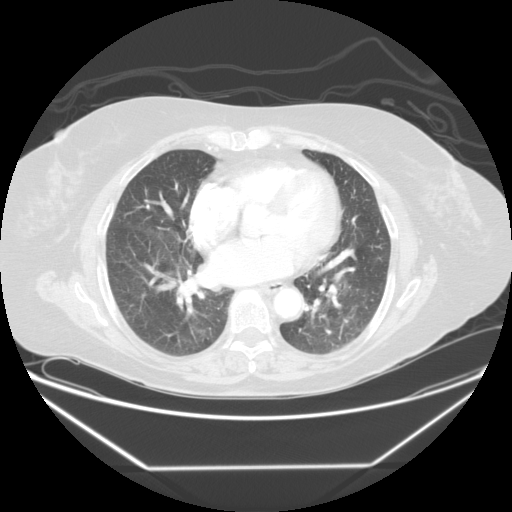
[im 68/112  lung]
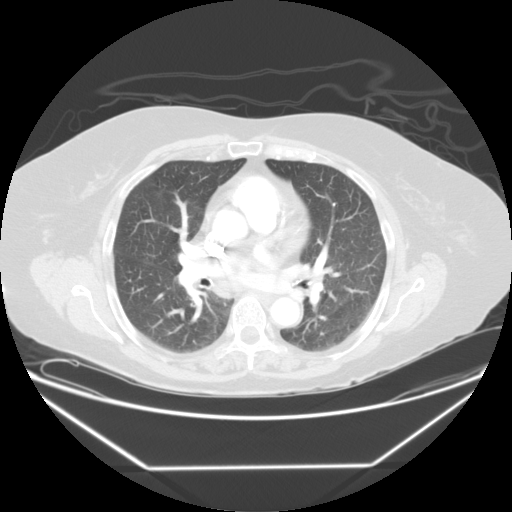

[Series 4: recon 3: pe · axial · 0.84mm/px · z∈[-252,-44]mm · 8 of 278 slices shown]
[im 35/278  lung]
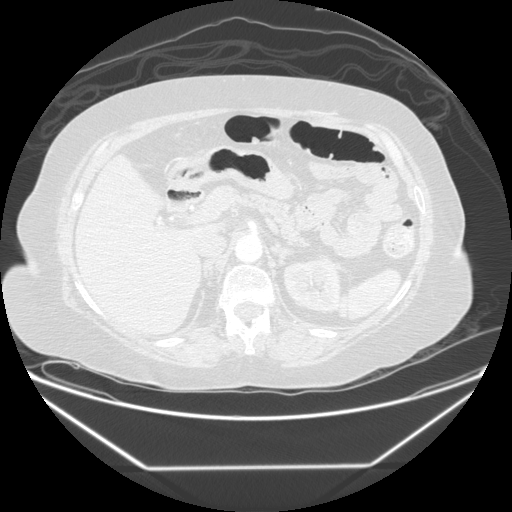
[im 70/278  mediastinal]
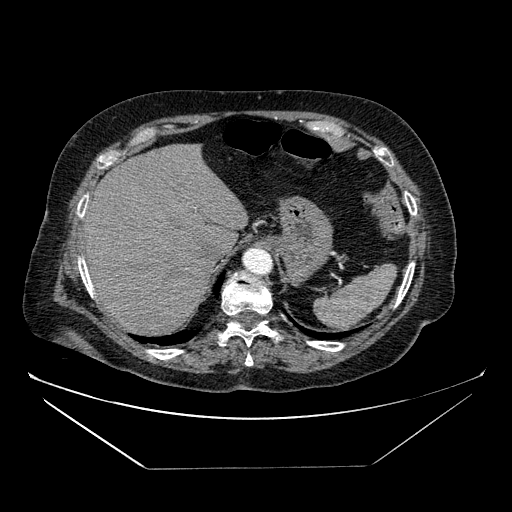
[im 104/278  lung]
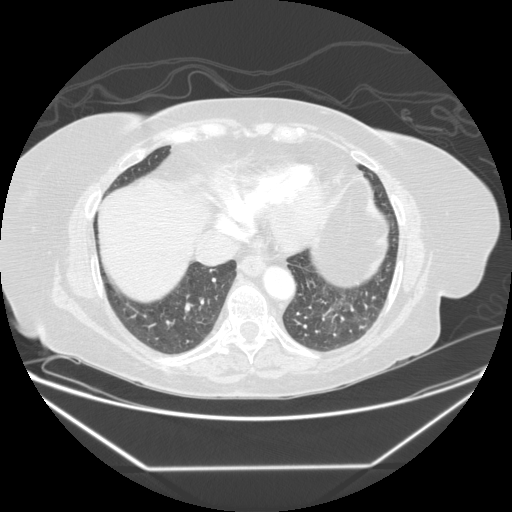
[im 139/278  mediastinal]
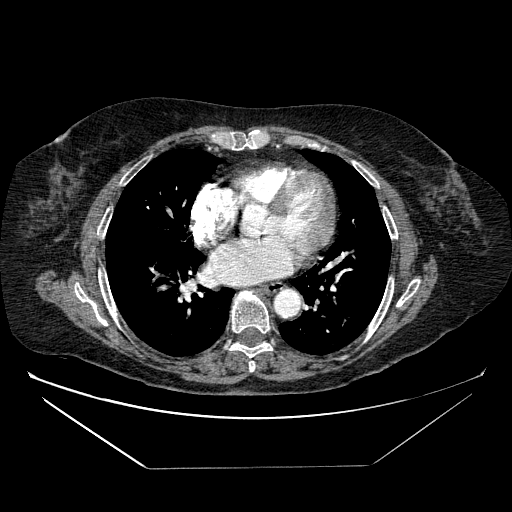
[im 168/278  lung]
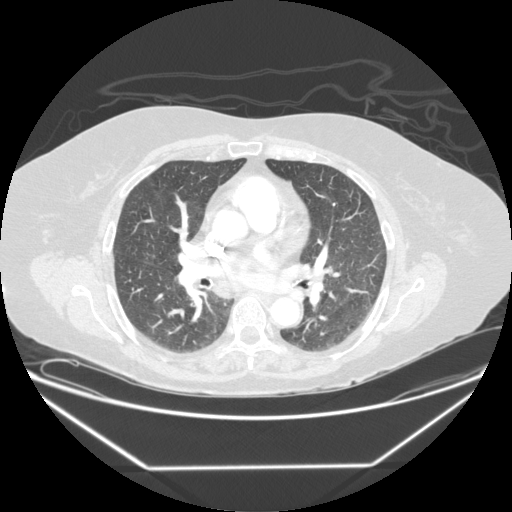
[im 174/278  mediastinal]
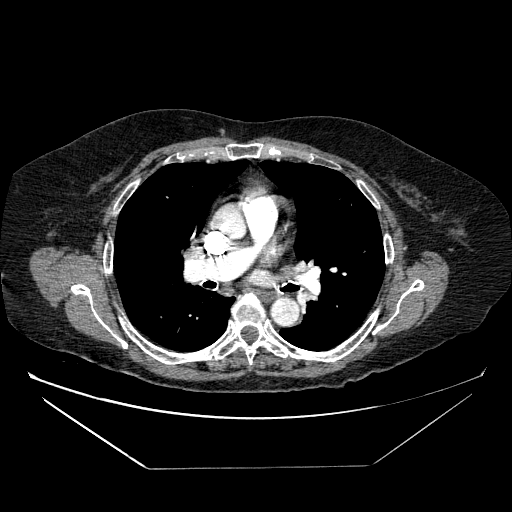
[im 208/278  lung]
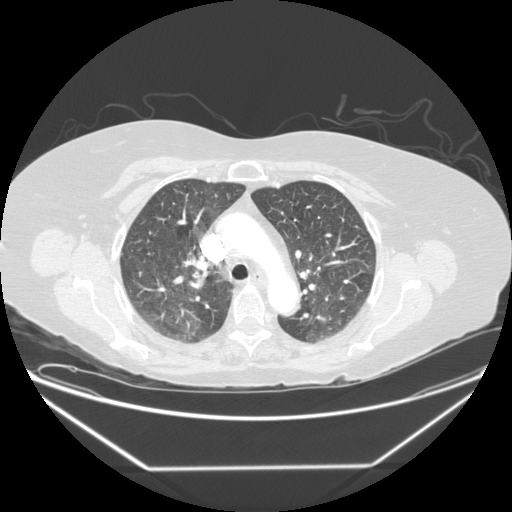
[im 243/278  mediastinal]
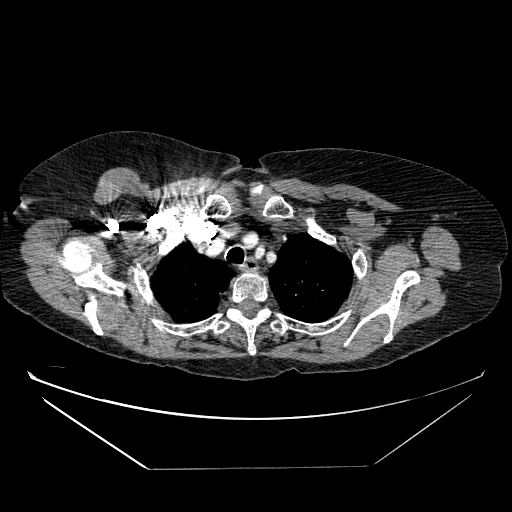

[Series 400: coronals · coronal · 0.84mm/px · 2 of 119 slices shown]
[im 40/119  lung]
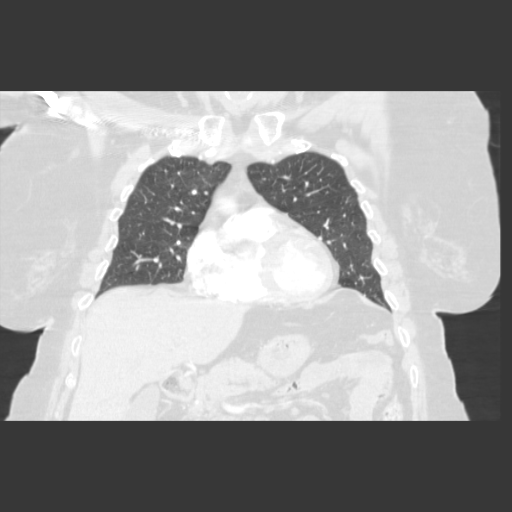
[im 79/119  lung]
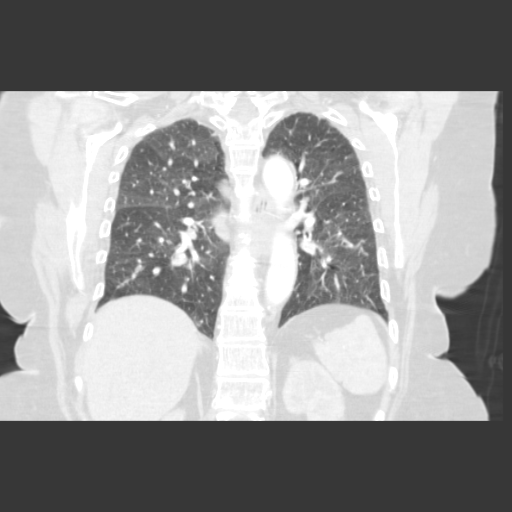

[Series 401: sagittals · sagittal · 0.84mm/px · 2 of 143 slices shown]
[im 48/143  lung]
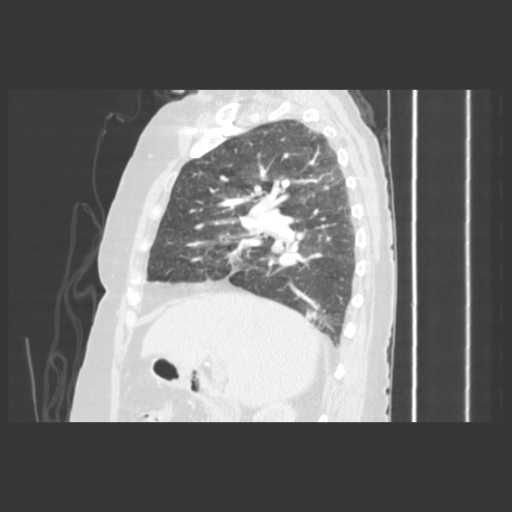
[im 95/143  lung]
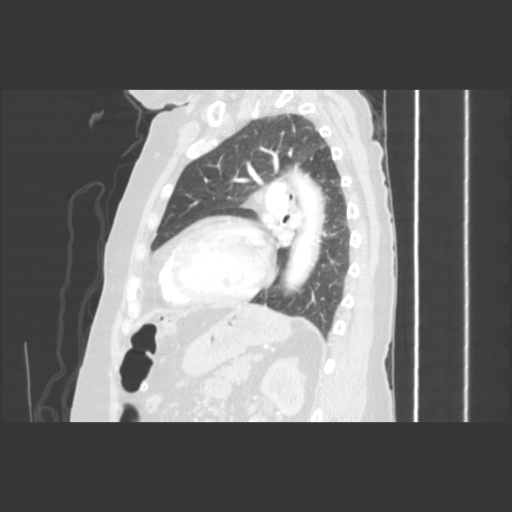

[14 of 30 positions shown; findings below may reference images not displayed]

FINDINGS: There is no evidence of significant pulmonary embolus.

There is a slightly mosaic pattern of lung attenuation bilaterally.
Mild focal atelectasis at the right lung base reflects a focal
herniation of fat across the right hemidiaphragm.  There is no
evidence of significant focal consolidation, pleural effusion or
pneumothorax.  No masses are identified; no abnormal focal contrast
enhancement is seen.

The mediastinum is unremarkable in appearance.  No mediastinal
lymphadenopathy is seen.  No pericardial effusion is identified.
The great vessels are unremarkable in appearance.  No axillary
lymphadenopathy is seen.  The thyroid gland is diminutive and
unremarkable in appearance.

The visualized portions of the liver and spleen are unremarkable.
Multiple left renal parapelvic cysts are noted.

No acute osseous abnormalities are seen.
IMPRESSION: 1.  No evidence of significant pulmonary embolus.
2.  Slightly mosaic pattern of lung attenuation noted bilaterally;
this is nonspecific, and can reflect a variety of etiologies.
3.  Mild focal atelectasis at the right lung base, reflecting a
focal herniation of fat across the right hemidiaphragm.
4.  Multiple left renal parapelvic cysts noted.

## 2013-05-03 ENCOUNTER — Encounter (HOSPITAL_COMMUNITY): Payer: Self-pay

## 2013-05-03 ENCOUNTER — Encounter (HOSPITAL_COMMUNITY)
Admission: RE | Admit: 2013-05-03 | Discharge: 2013-05-03 | Disposition: A | Discharge status: Home / Self Care | Payer: Medicare Other | Source: Ambulatory Visit | Attending: Internal Medicine | Admitting: Internal Medicine

## 2013-05-03 DIAGNOSIS — Z5189 Encounter for other specified aftercare: Secondary | ICD-10-CM | POA: Insufficient documentation

## 2013-05-03 DIAGNOSIS — R0609 Other forms of dyspnea: Secondary | ICD-10-CM | POA: Insufficient documentation

## 2013-05-03 DIAGNOSIS — R0602 Shortness of breath: Secondary | ICD-10-CM | POA: Insufficient documentation

## 2013-05-03 DIAGNOSIS — R0989 Other specified symptoms and signs involving the circulatory and respiratory systems: Secondary | ICD-10-CM | POA: Insufficient documentation

## 2013-05-03 DIAGNOSIS — I10 Essential (primary) hypertension: Secondary | ICD-10-CM | POA: Insufficient documentation

## 2013-05-03 NOTE — Progress Notes (Addendum)
Ms. Krystal Hopkins came in today for orientation to Pulmonary Rehab . Well dressed , does not appear her stated age, color good, skin warm and dry.  She walked from [facility] entrance, mild dyspnea with this walk at her pace.  Her legs are edematous , non pitting, good distal pulses.   Heart rate regular.  She denies any discomfort in joints or back, just gets short of breath when walking.   She has referred to her age as 89 during our conversation, states since she turned 69  She has not been able to travel with her daughter and grandson and this is what she loves to do.  Her daughter has talked of a trip to [place] for both their birthday celebrations and she feels she just would not be able to make the trip.  We discussed this as a goal for her Pulmonary Rehab to improve walking and her confidence.  She agrees.  Department expectations were discussed with her and goals were set. Demonstration and practice of PLB using pulse oximeter.  Patient able to return demonstration satisfactorily. She will return for walk test on 05/04/13 and begin exercise on 05/09/13.  We look forward to working with this nice lady.        11:00 am to 11:35 am Cathie Olden RN

## 2013-05-04 ENCOUNTER — Encounter (HOSPITAL_COMMUNITY)
Admission: RE | Admit: 2013-05-04 | Discharge: 2013-05-04 | Disposition: A | Discharge status: Home / Self Care | Payer: Medicare Other | Source: Ambulatory Visit | Attending: Internal Medicine | Admitting: Internal Medicine

## 2013-05-04 ENCOUNTER — Encounter (HOSPITAL_COMMUNITY): Payer: Medicare Other

## 2013-05-04 DIAGNOSIS — R0989 Other specified symptoms and signs involving the circulatory and respiratory systems: Secondary | ICD-10-CM | POA: Insufficient documentation

## 2013-05-04 DIAGNOSIS — R0602 Shortness of breath: Secondary | ICD-10-CM | POA: Insufficient documentation

## 2013-05-04 DIAGNOSIS — R0609 Other forms of dyspnea: Secondary | ICD-10-CM | POA: Insufficient documentation

## 2013-05-04 DIAGNOSIS — Z5189 Encounter for other specified aftercare: Secondary | ICD-10-CM | POA: Insufficient documentation

## 2013-05-04 DIAGNOSIS — I10 Essential (primary) hypertension: Secondary | ICD-10-CM | POA: Insufficient documentation

## 2013-05-09 ENCOUNTER — Encounter (HOSPITAL_COMMUNITY): Payer: Medicare Other

## 2013-05-09 ENCOUNTER — Encounter (HOSPITAL_COMMUNITY)
Admission: RE | Admit: 2013-05-09 | Discharge: 2013-05-09 | Disposition: A | Discharge status: Home / Self Care | Payer: Medicare Other | Source: Ambulatory Visit | Attending: Internal Medicine | Admitting: Internal Medicine

## 2013-05-09 DIAGNOSIS — R0989 Other specified symptoms and signs involving the circulatory and respiratory systems: Secondary | ICD-10-CM | POA: Insufficient documentation

## 2013-05-09 DIAGNOSIS — R0602 Shortness of breath: Secondary | ICD-10-CM | POA: Insufficient documentation

## 2013-05-09 DIAGNOSIS — R0609 Other forms of dyspnea: Secondary | ICD-10-CM | POA: Insufficient documentation

## 2013-05-09 DIAGNOSIS — Z5189 Encounter for other specified aftercare: Secondary | ICD-10-CM | POA: Insufficient documentation

## 2013-05-09 DIAGNOSIS — I10 Essential (primary) hypertension: Secondary | ICD-10-CM | POA: Insufficient documentation

## 2013-05-09 NOTE — Progress Notes (Signed)
First day of exercise in Pulmonary Rehab.  Oriented to equipment use and safety, hand hygiene.  Demonstration and practice of PLB on each exercise station.  No difficulties encountered with the exercise.  Vital signs stable, oxygen 96-98 % on room air, heart rate 69-76, B/P 138/52-102/68.   Cathie Olden RN

## 2013-05-11 ENCOUNTER — Encounter (HOSPITAL_COMMUNITY): Payer: Medicare Other

## 2013-05-16 ENCOUNTER — Encounter (HOSPITAL_COMMUNITY): Payer: Medicare Other

## 2013-05-16 ENCOUNTER — Encounter (HOSPITAL_COMMUNITY)
Admission: RE | Admit: 2013-05-16 | Discharge: 2013-05-16 | Disposition: A | Discharge status: Home / Self Care | Payer: Medicare Other | Source: Ambulatory Visit | Attending: Internal Medicine | Admitting: Internal Medicine

## 2013-05-18 ENCOUNTER — Encounter (HOSPITAL_COMMUNITY)
Admission: RE | Admit: 2013-05-18 | Discharge: 2013-05-18 | Disposition: A | Discharge status: Home / Self Care | Payer: Medicare Other | Source: Ambulatory Visit | Attending: Internal Medicine | Admitting: Internal Medicine

## 2013-05-18 ENCOUNTER — Encounter (HOSPITAL_COMMUNITY): Payer: Medicare Other

## 2013-05-18 NOTE — Progress Notes (Signed)
This is patient's 4th exercise session in pulmonary rehab and actually her first full week.  The first 2 sessions were spaced out due to a trip.  Today we spent an extended period of time (12 minutes) covering purse lip breathing, pacing activity while exercising, and family history of heart failure.  Discussed importance of daily weights, sodium intake, and exercise related to heart failure.  On her previous exercise session she exercised at too fast of a pace and tired easily.  Today we concentrated on exercising at a level she can sustain for the 45 minutes of exercise here in pulmonary rehab.

## 2013-05-23 ENCOUNTER — Encounter (HOSPITAL_COMMUNITY): Payer: Medicare Other

## 2013-05-23 ENCOUNTER — Encounter (HOSPITAL_COMMUNITY)
Admission: RE | Admit: 2013-05-23 | Discharge: 2013-05-23 | Disposition: A | Discharge status: Home / Self Care | Payer: Medicare Other | Source: Ambulatory Visit | Attending: Internal Medicine | Admitting: Internal Medicine

## 2013-05-25 ENCOUNTER — Encounter (HOSPITAL_COMMUNITY)
Admission: RE | Admit: 2013-05-25 | Discharge: 2013-05-25 | Disposition: A | Discharge status: Home / Self Care | Payer: Medicare Other | Source: Ambulatory Visit | Attending: Internal Medicine | Admitting: Internal Medicine

## 2013-05-25 ENCOUNTER — Encounter (HOSPITAL_COMMUNITY): Payer: Medicare Other

## 2013-05-30 ENCOUNTER — Encounter (HOSPITAL_COMMUNITY): Payer: Self-pay

## 2013-05-30 ENCOUNTER — Encounter (HOSPITAL_COMMUNITY)
Admission: RE | Admit: 2013-05-30 | Discharge: 2013-05-30 | Disposition: A | Discharge status: Home / Self Care | Payer: Medicare Other | Source: Ambulatory Visit | Attending: Internal Medicine | Admitting: Internal Medicine

## 2013-05-30 ENCOUNTER — Encounter (HOSPITAL_COMMUNITY): Payer: Medicare Other

## 2013-05-30 NOTE — Progress Notes (Signed)
I have reviewed a Home Exercise Program with the patient. Krystal Hopkins will be walking 2 days a week for 10 minutes around her neigborhood in addition to the 2 days a week in Pulmonary Rehab.  Patient is also instructed to take a 1 minute break every 5 minutes of exercise.  Patient is advised to add 5 minutes to her walk every 2 weeks. The patient was given a booklet regarding theraband exercises and instructed to complete these exercises twice a week.  We reviewed exercise guidelines, target heart rate during exercise, oxygen use, weather, home pulse oximeter, endpoints for exercise, and goals.  Patient has stated that they understand and are encouraged to come to me with any questions.  Patient is concerned about the warm weather and humidity on her breathing.  Patient was advised to call her insurance company to see if they offer Silver Sneakers.  This would allow her to exercise indoors at the [facility] which she says she would love to do.

## 2013-05-31 ENCOUNTER — Ambulatory Visit (INDEPENDENT_AMBULATORY_CARE_PROVIDER_SITE_OTHER): Payer: Medicare Other | Admitting: Internal Medicine

## 2013-05-31 ENCOUNTER — Encounter: Payer: Self-pay | Admitting: Internal Medicine

## 2013-05-31 ENCOUNTER — Other Ambulatory Visit: Payer: Medicare Other

## 2013-05-31 VITALS — BP 122/78 | HR 67 | Temp 97.7°F | Ht 64.0 in | Wt 221.6 lb

## 2013-05-31 DIAGNOSIS — E559 Vitamin D deficiency, unspecified: Secondary | ICD-10-CM

## 2013-05-31 DIAGNOSIS — R5383 Other fatigue: Secondary | ICD-10-CM

## 2013-05-31 DIAGNOSIS — R0609 Other forms of dyspnea: Secondary | ICD-10-CM

## 2013-05-31 DIAGNOSIS — R5381 Other malaise: Secondary | ICD-10-CM

## 2013-05-31 DIAGNOSIS — R0600 Dyspnea, unspecified: Secondary | ICD-10-CM

## 2013-05-31 NOTE — Progress Notes (Signed)
Subjective:    Patient ID: Krystal Hopkins, female    DOB: 08/29/1932, 79 y.o.   MRN: [id number]  HPI REferred by Dr Jeanella Cara for dyspnea  IOV 12/27/2012  79 year old female. Reports dyspnea. Says "I cannot catch my breath". Every spring she goes to [place] with daughter but after last spring visit, developed symptoms. Last spring she was in [place] and [place]: and noticed exertional dyspnea with hill walking. AFter that dyspneic is episodic and is unrelated to exertion.Says dyspnea is episodic. Episodes have happened only 4 times (once after seeing cardiology). Definitely unrelated to exertion. Episodes occur suddenly without warning. Of these 4, 2 happened in Dr Zoe Lan office and in both cough preceded dyspnea. Other 2 times happened again in day time. All without warning. She demonstrates to me a choking kind of whoop that is making her dyspneic. Lasts several seconds.   Otherwise, denies wheeze, Patient reports there is concern for ILD on CXR and therefore referred here. Patient wonders if her obesity (BMI almost 72) and age is making her dyspneic but wonders why this is not related to exertion. Exam shows crackles at the base. Walking desaturation test 185 feet x3 laps did not desaturate    Dyspnea relevant history  - Former 20 pack smoker. Quit 1972  - Imaging : 01/19/2012: CT angiogram of the chest was negative for pulmonary embolism but showed monocytes at an ablation of the chest with right lower lobe focal opacity was subtle and was thought to represent a fat herniation to the right hemidiaphragm. 10/26/2012: Chest x-ray showed stable lung findings with some bronchitis changes  - Near morbid obesity : Body mass index is 39.15 kg/(m^2). - obesity  - Echocardiogram 11/02/2012: According to the outside report. She had normal systolic global function with an ejection fraction of 56%. The left atrial cavity was slightly dilated and she had trace mitral and tricuspid regurgitation with  borderline elevated pulmonary artery systolic pressure.  - Nuclear medicine stress test 10/28/2012: EKG showed poor R-wave progression but stress EKG was nondiagnostic for ischemia. Dynamic gaited images revealed normal wall motion. The left ventricle ejection fraction was 71%. This was considered a low-risk study   Chronic cough  She reports mild cough x 1 year. Describes it as just  irritating. Is so mild that she would not have come here for opinion. Not getting worse; stable. Dry cough. All day. Does not wake her up but worst when she first gets up; has to clear throat a lot. DEnies associated sinus draingae, gerd, Rates cough as a level 2 of 10.    Dr Gretta Cool Reflux Symptom Index (> 13-15 suggestive of LPR cough) 0 -> 5  =  none ->severe problem 12/27/2012   Hoarseness of problem with voice 1  Clearing  Of Throat 3  Excess throat mucus or feeling of post nasal drip 2  Difficulty swallowing food, liquid or tablets 1  Cough after eating or lying down 0  Breathing difficulties or choking episodes 0  Troublesome or annoying cough 2  Sensation of something sticking in throat or lump in throat 3  Heartburn, chest pain, indigestion, or stomach acid coming up 0  TOTAL 12     No reportss  Of orthopnea, gerd, hemoptysis, edema (other than baseline mild), paroxysmal nocturnal dyspnea. QRS and a year ago look for blood clot in the lung that   I am not completely sure what is going on  Please have CT scan of the chest to  look for interstitial lung disease  Please have pulmonary function test  Return to see me after the above  Please complete all the above in the next few weeks    OV 02/13/2013  CT chest 12/30/12 - normal   PFT 01/10/13 -  Function test 01/10/2013: FVC 1.5 L/64%. FEV1 1.1 L/64% FEV1 22% bronchodilator response. Ratio is normal at 74/100%. Small airways 0.9 L/60% and normal. Spirometry suggests restriction but total lung capacity is 4.3 L/90% and normal. DLCO is 18.5/85%  and normal  CPST 01/26/13   CPX: The RER of 1.06 indicates a near maximal effort. The peak VO2 is normal at 11.1 ml/kg/min (91% of the age/gender/weight matched sedentary norm). When adjusted to the patients ideal body weight of 139 lb (62.9 kg) the peak VO2 is 16 ml/kg (ibw)/min (112% of the IBW adjusted predicted). The improvement from low-normal to high-normal when corrected for IBW suggests obesity likely playing a role in dyspnea. The VO2 at the ventilatory threshold is normal at 67% of the predicted peak VO2. At peak exercise the ventilation was 45% of the measured MVV indicating ventilatory reserves remained (respiratory rate and Vt/IC [58%] were below the expected range and PETCO2 was mildly elevated at peak exercise). There was a borderline low HR response to the exercise with a HR reserve of 19 bpm. The O2pulse (a surrogate for stroke volume) increased very slowly throughout the exercise reaching 9 ml/beat (100% of predicted) and was flat for the last 1/3 of peak exercise suggesting diastolic dysfunction. The VE/VCO2 slope is normal. The oxygen uptake efficiency slope (OUES) is normal and reflects the patient's measured functional capacity.  Conclusion: Exercise testing with gas exchange demonstrates a normal functional capacity when compared to matched sedentary norms. There does not appear to be a clear ventilatory limitations. There appears to be mild circulatory limitation in terms of diastolic dysfunction. IN addition her body habitus, deconditioning, and age-related decline may all be contributing to her exercise intolerance.   Test completed and report prepared by: Almedia Balls, MEd, ACSM-RCEP Sr. Exercise Physiologist 01/26/2013 3:47 PM   Signed and Confirmed By:  Dr. Kalman Shan, M.D., [facility].C.P Pulmonary and Critical Care Medicine Staff Physician [facility] Pager: [phone], If no answer or between  15:00h - 7:00h: call [phone]  02/10/2013 4:53 AM          Your shortness of breath is due to weight and also due to effects of weight and bp on your heart called diastolic dysfunction  In order to get better  - start pulmonary rehabilitation  - lose weight  Followup  - 2months to discuss weight loss    OV 05/31/2013  This is a followup for dyspnea related to obesity and diastolic dysfunction. Since her last visit she started pulmonary rehabilitation past 3 weeks and has lost 6 pounds. With this she is reporting improved dyspnea. She still has some fatigue. She still has obesity. She has had nutrition instruction from pulmonary rehabilitation and is not interested in getting any further nutritional instructions from me. There no new health issues. She is interested in some workup for fatigue   Past, Family, Social reviewed: no change since last visit   Review of Systems  Constitutional: Negative for fever and unexpected weight change.  HENT: Negative for ear pain, nosebleeds, congestion, sore throat, rhinorrhea, sneezing, trouble swallowing, dental problem, postnasal drip and sinus pressure.   Eyes: Negative for redness and itching.  Respiratory: Negative  for cough, chest tightness, shortness of breath and wheezing.   Cardiovascular: Negative for palpitations and leg swelling.  Gastrointestinal: Negative for nausea and vomiting.  Genitourinary: Negative for dysuria.  Musculoskeletal: Negative for joint swelling.  Skin: Negative for rash.  Neurological: Negative for headaches.  Hematological: Does not bruise/bleed easily.  Psychiatric/Behavioral: Negative for dysphoric mood. The patient is not nervous/anxious.        Objective:   Physical Exam  Vitals reviewed. Constitutional: She is oriented to person, place, and time. She appears well-developed and well-nourished. No distress.  Body mass index is 38.02 kg/(m^2).   HENT:  Head: Normocephalic and atraumatic.  Right  Ear: External ear normal.  Left Ear: External ear normal.  Mouth/Throat: Oropharynx is clear and moist. No oropharyngeal exudate.  Eyes: Conjunctivae and EOM are normal. Pupils are equal, round, and reactive to light. Right eye exhibits no discharge. Left eye exhibits no discharge. No scleral icterus.  Neck: Normal range of motion. Neck supple. No JVD present. No tracheal deviation present. No thyromegaly present.  Cardiovascular: Normal rate, regular rhythm, normal heart sounds and intact distal pulses.  Exam reveals no gallop and no friction rub.   No murmur heard. Pulmonary/Chest: Effort normal and breath sounds normal. No respiratory distress. She has no wheezes. She has no rales. She exhibits no tenderness.  Abdominal: Soft. Bowel sounds are normal. She exhibits no distension and no mass. There is no tenderness. There is no rebound and no guarding.  Musculoskeletal: Normal range of motion. She exhibits no edema and no tenderness.  Lymphadenopathy:    She has no cervical adenopathy.  Neurological: She is alert and oriented to person, place, and time. She has normal reflexes. No cranial nerve deficit. She exhibits normal muscle tone. Coordination normal.  Skin: Skin is warm and dry. No rash noted. She is not diaphoretic. No erythema. No pallor.  Psychiatric: She has a normal mood and affect. Her behavior is normal. Judgment and thought content normal.          Assessment & Plan:

## 2013-05-31 NOTE — Patient Instructions (Addendum)
#  Shortness of Breath  - Glad this is better with rehabilitation and weight loss - Continue rehabilitation and weight loss - No further followup  #Fatigue - CMA will order vitamin D level; if this is abnormal I will call in treatment - If vitamin D is normal, you will have to follow fatigue with her primary care physician  #Followup No further followup planned Return as needed

## 2013-06-01 ENCOUNTER — Encounter (HOSPITAL_COMMUNITY): Payer: Medicare Other

## 2013-06-01 ENCOUNTER — Encounter (HOSPITAL_COMMUNITY)
Admission: RE | Admit: 2013-06-01 | Discharge: 2013-06-01 | Disposition: A | Discharge status: Home / Self Care | Payer: Medicare Other | Source: Ambulatory Visit | Attending: Internal Medicine | Admitting: Internal Medicine

## 2013-06-02 ENCOUNTER — Encounter: Payer: Self-pay | Admitting: *Deleted

## 2013-06-04 DIAGNOSIS — R5383 Other fatigue: Secondary | ICD-10-CM | POA: Insufficient documentation

## 2013-06-04 NOTE — Assessment & Plan Note (Signed)
#  Fatigue - CMA will order vitamin D level; if this is abnormal I will call in treatment - If vitamin D is normal, you will have to follow fatigue with her primary care physician  #Followup No further followup planned Return as needed

## 2013-06-04 NOTE — Assessment & Plan Note (Signed)
#  Shortness of Breath  - Glad this is better with rehabilitation and weight loss - Continue rehabilitation and weight loss program that you have designed for yourself - No further followup

## 2013-06-06 ENCOUNTER — Encounter (HOSPITAL_COMMUNITY)
Admission: RE | Admit: 2013-06-06 | Discharge: 2013-06-06 | Disposition: A | Discharge status: Home / Self Care | Payer: Medicare Other | Source: Ambulatory Visit | Attending: Internal Medicine | Admitting: Internal Medicine

## 2013-06-06 ENCOUNTER — Encounter (HOSPITAL_COMMUNITY): Payer: Medicare Other

## 2013-06-06 DIAGNOSIS — Z5189 Encounter for other specified aftercare: Secondary | ICD-10-CM | POA: Insufficient documentation

## 2013-06-06 DIAGNOSIS — I10 Essential (primary) hypertension: Secondary | ICD-10-CM | POA: Insufficient documentation

## 2013-06-06 DIAGNOSIS — R0989 Other specified symptoms and signs involving the circulatory and respiratory systems: Secondary | ICD-10-CM | POA: Insufficient documentation

## 2013-06-06 DIAGNOSIS — R0609 Other forms of dyspnea: Secondary | ICD-10-CM | POA: Insufficient documentation

## 2013-06-06 DIAGNOSIS — R0602 Shortness of breath: Secondary | ICD-10-CM | POA: Insufficient documentation

## 2013-06-08 ENCOUNTER — Encounter (HOSPITAL_COMMUNITY): Payer: Medicare Other

## 2013-06-08 ENCOUNTER — Encounter (HOSPITAL_COMMUNITY)
Admission: RE | Admit: 2013-06-08 | Discharge: 2013-06-08 | Disposition: A | Discharge status: Home / Self Care | Payer: Medicare Other | Source: Ambulatory Visit | Attending: Internal Medicine | Admitting: Internal Medicine

## 2013-06-13 ENCOUNTER — Encounter (HOSPITAL_COMMUNITY): Payer: Medicare Other

## 2013-06-13 ENCOUNTER — Encounter (HOSPITAL_COMMUNITY)
Admission: RE | Admit: 2013-06-13 | Discharge: 2013-06-13 | Disposition: A | Discharge status: Home / Self Care | Payer: Medicare Other | Source: Ambulatory Visit | Attending: Internal Medicine | Admitting: Internal Medicine

## 2013-06-15 ENCOUNTER — Encounter (HOSPITAL_COMMUNITY): Payer: Medicare Other

## 2013-06-15 ENCOUNTER — Encounter (HOSPITAL_COMMUNITY)
Admission: RE | Admit: 2013-06-15 | Discharge: 2013-06-15 | Disposition: A | Discharge status: Home / Self Care | Payer: Medicare Other | Source: Ambulatory Visit | Attending: Internal Medicine | Admitting: Internal Medicine

## 2013-06-20 ENCOUNTER — Encounter (HOSPITAL_COMMUNITY): Payer: Medicare Other

## 2013-06-20 ENCOUNTER — Encounter (HOSPITAL_COMMUNITY)
Admission: RE | Admit: 2013-06-20 | Discharge: 2013-06-20 | Disposition: A | Discharge status: Home / Self Care | Payer: Medicare Other | Source: Ambulatory Visit | Attending: Internal Medicine | Admitting: Internal Medicine

## 2013-06-22 ENCOUNTER — Encounter (HOSPITAL_COMMUNITY): Payer: Medicare Other

## 2013-06-22 ENCOUNTER — Encounter (HOSPITAL_COMMUNITY)
Admission: RE | Admit: 2013-06-22 | Discharge: 2013-06-22 | Disposition: A | Discharge status: Home / Self Care | Payer: Medicare Other | Source: Ambulatory Visit | Attending: Internal Medicine | Admitting: Internal Medicine

## 2013-06-27 ENCOUNTER — Encounter (HOSPITAL_COMMUNITY)
Admission: RE | Admit: 2013-06-27 | Discharge: 2013-06-27 | Disposition: A | Discharge status: Home / Self Care | Payer: Medicare Other | Source: Ambulatory Visit | Attending: Internal Medicine | Admitting: Internal Medicine

## 2013-06-27 ENCOUNTER — Encounter (HOSPITAL_COMMUNITY): Payer: Medicare Other

## 2013-06-29 ENCOUNTER — Encounter (HOSPITAL_COMMUNITY)
Admission: RE | Admit: 2013-06-29 | Discharge: 2013-06-29 | Disposition: A | Discharge status: Home / Self Care | Payer: Medicare Other | Source: Ambulatory Visit | Attending: Internal Medicine | Admitting: Internal Medicine

## 2013-06-29 ENCOUNTER — Encounter (HOSPITAL_COMMUNITY): Payer: Medicare Other

## 2013-07-04 ENCOUNTER — Encounter (HOSPITAL_COMMUNITY)
Admission: RE | Admit: 2013-07-04 | Discharge: 2013-07-04 | Disposition: A | Discharge status: Home / Self Care | Payer: Medicare Other | Source: Ambulatory Visit | Attending: Internal Medicine | Admitting: Internal Medicine

## 2013-07-04 ENCOUNTER — Encounter (HOSPITAL_COMMUNITY): Payer: Medicare Other

## 2013-07-06 ENCOUNTER — Encounter (HOSPITAL_COMMUNITY)
Admission: RE | Admit: 2013-07-06 | Discharge: 2013-07-06 | Disposition: A | Discharge status: Home / Self Care | Payer: Medicare Other | Source: Ambulatory Visit | Attending: Internal Medicine | Admitting: Internal Medicine

## 2013-07-06 ENCOUNTER — Encounter (HOSPITAL_COMMUNITY): Payer: Medicare Other

## 2013-07-06 DIAGNOSIS — R0989 Other specified symptoms and signs involving the circulatory and respiratory systems: Secondary | ICD-10-CM | POA: Insufficient documentation

## 2013-07-06 DIAGNOSIS — R0609 Other forms of dyspnea: Secondary | ICD-10-CM | POA: Insufficient documentation

## 2013-07-06 DIAGNOSIS — Z5189 Encounter for other specified aftercare: Secondary | ICD-10-CM | POA: Insufficient documentation

## 2013-07-06 DIAGNOSIS — R0602 Shortness of breath: Secondary | ICD-10-CM | POA: Insufficient documentation

## 2013-07-06 DIAGNOSIS — I10 Essential (primary) hypertension: Secondary | ICD-10-CM | POA: Insufficient documentation

## 2013-07-11 ENCOUNTER — Encounter (HOSPITAL_COMMUNITY): Payer: Medicare Other

## 2013-07-11 ENCOUNTER — Encounter (HOSPITAL_COMMUNITY)
Admission: RE | Admit: 2013-07-11 | Discharge: 2013-07-11 | Disposition: A | Discharge status: Home / Self Care | Payer: Medicare Other | Source: Ambulatory Visit | Attending: Internal Medicine | Admitting: Internal Medicine

## 2013-07-13 ENCOUNTER — Encounter (HOSPITAL_COMMUNITY): Payer: Medicare Other

## 2013-07-18 ENCOUNTER — Encounter (HOSPITAL_COMMUNITY)
Admission: RE | Admit: 2013-07-18 | Discharge: 2013-07-18 | Disposition: A | Discharge status: Home / Self Care | Payer: Medicare Other | Source: Ambulatory Visit | Attending: Internal Medicine | Admitting: Internal Medicine

## 2013-07-18 ENCOUNTER — Encounter (HOSPITAL_COMMUNITY): Payer: Medicare Other

## 2013-07-20 ENCOUNTER — Encounter (HOSPITAL_COMMUNITY)
Admission: RE | Admit: 2013-07-20 | Discharge: 2013-07-20 | Disposition: A | Discharge status: Home / Self Care | Payer: Medicare Other | Source: Ambulatory Visit | Attending: Internal Medicine | Admitting: Internal Medicine

## 2013-07-20 ENCOUNTER — Encounter (HOSPITAL_COMMUNITY): Payer: Medicare Other

## 2013-07-25 ENCOUNTER — Encounter (HOSPITAL_COMMUNITY): Payer: Medicare Other

## 2013-07-25 ENCOUNTER — Encounter (HOSPITAL_COMMUNITY)
Admission: RE | Admit: 2013-07-25 | Discharge: 2013-07-25 | Disposition: A | Discharge status: Home / Self Care | Payer: Medicare Other | Source: Ambulatory Visit | Attending: Internal Medicine | Admitting: Internal Medicine

## 2013-07-27 ENCOUNTER — Encounter (HOSPITAL_COMMUNITY)
Admission: RE | Admit: 2013-07-27 | Discharge: 2013-07-27 | Disposition: A | Discharge status: Home / Self Care | Payer: Medicare Other | Source: Ambulatory Visit | Attending: Internal Medicine | Admitting: Internal Medicine

## 2013-07-27 ENCOUNTER — Encounter (HOSPITAL_COMMUNITY): Payer: Medicare Other

## 2013-08-01 ENCOUNTER — Encounter (HOSPITAL_COMMUNITY): Payer: Medicare Other

## 2013-08-01 ENCOUNTER — Encounter (HOSPITAL_COMMUNITY)
Admission: RE | Admit: 2013-08-01 | Discharge: 2013-08-01 | Disposition: A | Discharge status: Home / Self Care | Payer: Medicare Other | Source: Ambulatory Visit | Attending: Internal Medicine | Admitting: Internal Medicine

## 2013-08-03 ENCOUNTER — Encounter (HOSPITAL_COMMUNITY)
Admission: RE | Admit: 2013-08-03 | Discharge: 2013-08-03 | Disposition: A | Discharge status: Home / Self Care | Payer: Medicare Other | Source: Ambulatory Visit | Attending: Internal Medicine | Admitting: Internal Medicine

## 2013-08-03 ENCOUNTER — Encounter (HOSPITAL_COMMUNITY): Payer: Medicare Other

## 2013-08-08 ENCOUNTER — Encounter (HOSPITAL_COMMUNITY): Payer: Medicare Other

## 2013-08-08 ENCOUNTER — Other Ambulatory Visit (HOSPITAL_COMMUNITY): Payer: Self-pay | Admitting: Family Medicine

## 2013-08-08 ENCOUNTER — Encounter (HOSPITAL_COMMUNITY)
Admission: RE | Admit: 2013-08-08 | Discharge: 2013-08-08 | Disposition: A | Discharge status: Home / Self Care | Payer: Medicare Other | Source: Ambulatory Visit | Attending: Internal Medicine | Admitting: Internal Medicine

## 2013-08-08 DIAGNOSIS — M79604 Pain in right leg: Secondary | ICD-10-CM

## 2013-08-08 DIAGNOSIS — R0989 Other specified symptoms and signs involving the circulatory and respiratory systems: Secondary | ICD-10-CM | POA: Insufficient documentation

## 2013-08-08 DIAGNOSIS — R0602 Shortness of breath: Secondary | ICD-10-CM | POA: Insufficient documentation

## 2013-08-08 DIAGNOSIS — Z5189 Encounter for other specified aftercare: Secondary | ICD-10-CM | POA: Insufficient documentation

## 2013-08-08 DIAGNOSIS — R0609 Other forms of dyspnea: Secondary | ICD-10-CM | POA: Insufficient documentation

## 2013-08-08 DIAGNOSIS — I10 Essential (primary) hypertension: Secondary | ICD-10-CM | POA: Insufficient documentation

## 2013-08-10 ENCOUNTER — Ambulatory Visit (HOSPITAL_COMMUNITY)
Admission: RE | Admit: 2013-08-10 | Discharge: 2013-08-10 | Disposition: A | Discharge status: Home / Self Care | Payer: Medicare Other | Source: Ambulatory Visit | Attending: Nurse Practitioner | Admitting: Nurse Practitioner

## 2013-08-10 ENCOUNTER — Encounter (HOSPITAL_COMMUNITY)
Admission: RE | Admit: 2013-08-10 | Discharge: 2013-08-10 | Disposition: A | Discharge status: Home / Self Care | Payer: Medicare Other | Source: Ambulatory Visit | Attending: Internal Medicine | Admitting: Internal Medicine

## 2013-08-10 ENCOUNTER — Encounter (HOSPITAL_COMMUNITY): Payer: Medicare Other

## 2013-08-10 DIAGNOSIS — M79609 Pain in unspecified limb: Secondary | ICD-10-CM

## 2013-08-10 DIAGNOSIS — M79604 Pain in right leg: Secondary | ICD-10-CM

## 2013-08-10 DIAGNOSIS — I739 Peripheral vascular disease, unspecified: Secondary | ICD-10-CM | POA: Insufficient documentation

## 2013-08-10 NOTE — Progress Notes (Signed)
VASCULAR LAB PRELIMINARY  ARTERIAL  ABI completed:    RIGHT    LEFT    PRESSURE WAVEFORM  PRESSURE WAVEFORM  BRACHIAL 121 triphasic BRACHIAL 111 triphasic  DP   DP    AT 148 monophasic AT 129 monophasic  PT 161 triphasic PT 158 triphasic  PER   PER    GREAT TOE  NA GREAT TOE  NA    RIGHT LEFT  ABI >1.0 >1.0     Isami Mehra, RVT 08/10/2013, 10:04 AM

## 2013-08-14 NOTE — Progress Notes (Signed)
Patient completed the pulmonary rehab program on 08-10-2013.  Krystal Hopkins had excellent results.  She lost 35 pounds during the program and increased her exercise tolerance greatly.  She is going to continue exercising at home in preparation for a trip to [place] with her daughter and grandson this summer.  She was an asset to the program and was very supportive to fellow participants.

## 2013-08-15 ENCOUNTER — Encounter (HOSPITAL_COMMUNITY): Payer: Medicare Other

## 2013-08-17 ENCOUNTER — Encounter (HOSPITAL_COMMUNITY): Payer: Medicare Other

## 2013-08-22 ENCOUNTER — Encounter (HOSPITAL_COMMUNITY): Payer: Medicare Other

## 2013-08-24 ENCOUNTER — Encounter (HOSPITAL_COMMUNITY): Payer: Medicare Other

## 2013-08-29 ENCOUNTER — Encounter (HOSPITAL_COMMUNITY): Payer: Medicare Other

## 2013-08-31 ENCOUNTER — Encounter (HOSPITAL_COMMUNITY): Payer: Medicare Other

## 2013-09-05 ENCOUNTER — Encounter (HOSPITAL_COMMUNITY): Payer: Medicare Other

## 2013-09-07 ENCOUNTER — Encounter (HOSPITAL_COMMUNITY): Payer: Medicare Other

## 2013-09-12 ENCOUNTER — Encounter (HOSPITAL_COMMUNITY): Payer: Medicare Other

## 2013-09-14 ENCOUNTER — Encounter (HOSPITAL_COMMUNITY): Payer: Medicare Other

## 2013-09-19 ENCOUNTER — Encounter (HOSPITAL_COMMUNITY): Payer: Medicare Other

## 2013-09-21 ENCOUNTER — Encounter (HOSPITAL_COMMUNITY): Payer: Medicare Other

## 2013-09-26 ENCOUNTER — Encounter (HOSPITAL_COMMUNITY): Payer: Medicare Other

## 2013-09-28 ENCOUNTER — Encounter (HOSPITAL_COMMUNITY): Payer: Medicare Other

## 2013-10-03 ENCOUNTER — Encounter (HOSPITAL_COMMUNITY): Payer: Medicare Other

## 2013-10-05 ENCOUNTER — Encounter (HOSPITAL_COMMUNITY): Payer: Medicare Other

## 2013-10-10 ENCOUNTER — Encounter (HOSPITAL_COMMUNITY): Payer: Medicare Other

## 2013-10-12 ENCOUNTER — Encounter (HOSPITAL_COMMUNITY): Payer: Medicare Other

## 2013-10-17 ENCOUNTER — Encounter (HOSPITAL_COMMUNITY): Payer: Medicare Other

## 2013-10-19 ENCOUNTER — Encounter (HOSPITAL_COMMUNITY): Payer: Medicare Other

## 2013-10-24 ENCOUNTER — Encounter (HOSPITAL_COMMUNITY): Payer: Medicare Other

## 2013-10-26 ENCOUNTER — Encounter (HOSPITAL_COMMUNITY): Payer: Medicare Other

## 2013-10-31 ENCOUNTER — Encounter (HOSPITAL_COMMUNITY): Payer: Medicare Other

## 2013-11-02 ENCOUNTER — Encounter (HOSPITAL_COMMUNITY): Payer: Medicare Other

## 2013-11-07 ENCOUNTER — Encounter (HOSPITAL_COMMUNITY): Payer: Medicare Other

## 2013-11-09 ENCOUNTER — Encounter (HOSPITAL_COMMUNITY): Payer: Medicare Other

## 2013-11-14 ENCOUNTER — Encounter (HOSPITAL_COMMUNITY): Payer: Medicare Other

## 2013-11-16 ENCOUNTER — Encounter (HOSPITAL_COMMUNITY): Payer: Medicare Other

## 2013-11-21 ENCOUNTER — Encounter (HOSPITAL_COMMUNITY): Payer: Medicare Other

## 2013-11-23 ENCOUNTER — Encounter (HOSPITAL_COMMUNITY): Payer: Medicare Other

## 2013-11-28 ENCOUNTER — Encounter (HOSPITAL_COMMUNITY): Payer: Medicare Other

## 2013-11-30 ENCOUNTER — Encounter (HOSPITAL_COMMUNITY): Payer: Medicare Other

## 2014-07-06 ENCOUNTER — Encounter: Payer: Self-pay | Admitting: Gastroenterology

## 2014-07-11 ENCOUNTER — Encounter: Payer: Self-pay | Admitting: *Deleted

## 2014-07-18 ENCOUNTER — Ambulatory Visit (INDEPENDENT_AMBULATORY_CARE_PROVIDER_SITE_OTHER): Payer: Medicare Other | Admitting: Gastroenterology

## 2014-07-18 ENCOUNTER — Encounter: Payer: Self-pay | Admitting: Gastroenterology

## 2014-07-18 VITALS — BP 110/58 | HR 64 | Ht 62.25 in | Wt 163.1 lb

## 2014-07-18 DIAGNOSIS — R634 Abnormal weight loss: Secondary | ICD-10-CM

## 2014-07-18 DIAGNOSIS — R195 Other fecal abnormalities: Secondary | ICD-10-CM

## 2014-07-18 DIAGNOSIS — R1310 Dysphagia, unspecified: Secondary | ICD-10-CM | POA: Insufficient documentation

## 2014-07-18 NOTE — Progress Notes (Signed)
     07/18/2014 Krystal Hopkins [id number] 07/21/1932   History of Present Illness:  This is a pleasant 80 year old female who is known to Dr. Carlean Purl for colonoscopy in 11/2012 at which time she had two polyps removed from the transverse colon that were tubular adenomas, moderate diverticulosis, and internal hemorrhoids.  She presents to our office today at the request of her PCP for evaluation regarding heme positive stools and weight loss. The patient states that on routine physical her PCP had her check 3 stools for blood and told her that two out of the three had some blood in them.  She denies every seeing dark or bloody stools.  Her Hgb was 12.7 grams in September.  In regards to the weight loss, she says that she is not concerned about it because she has been trying very hard to lose weight.  She has lost about 60 pounds since 05/2013.  She says that she feels good and really has no complaints.  Upon further questioning she admits that maybe on occasion food seems to get hung up in her esophagus when it goes down, but nothing that bothers her much or causes her to choke, etc.  She's never had EGD in the past.   Current Medications, Allergies, Past Medical History, Past Surgical History, Family History and Social History were reviewed in [employer] record.   Physical Exam: BP 110/58  Pulse 64  Ht 5' 2.25" (1.581 m)  Wt 163 lb 2 oz (73.993 kg)  BMI 29.60 kg/m2 General: Well developed white female in no acute distress Head: Normocephalic and atraumatic Eyes:  Sclerae anicteric, conjunctiva pink  Ears: Normal auditory acuity Lungs: Clear throughout to auscultation Heart: Regular rate and rhythm; murmur noted Abdomen: Soft, non-distended.  Normal bowel sounds.  Non-tender. Musculoskeletal: Symmetrical with no gross deformities  Extremities: No edema  Neurological: Alert oriented x 4, grossly non-focal Psychological:  Alert and cooperative. Normal mood and  affect  Assessment and Recommendations: -Heme positive stool:  Could have been due to hemorrhoids, however, in combination with her weight loss of 60 pounds in the past year (which she is not concerned about and says that has been intentional) and occasional mild dysphagia, we will schedule for EGD with Dr. Carlean Purl for further evaluation.  Rule out esophagitis, stricture, malignancy, etc.  I do not think that she needs another colonoscopy at this point.  I explained to her that the chances of something new or "bad" developing in the past 20 months is very unusual, but can occur.  She is agreeable to not repeating colonoscopy at this time.  *The risks, benefits, and alternatives for EGD were discussed with the patient and she consents to proceed.

## 2014-07-18 NOTE — Patient Instructions (Signed)

## 2014-07-30 NOTE — Progress Notes (Signed)
Agree with Ms. Zehr's management.  Carl E. Gessner, MD, FACG  

## 2014-08-08 ENCOUNTER — Ambulatory Visit (AMBULATORY_SURGERY_CENTER): Payer: Medicare Other | Admitting: Internal Medicine

## 2014-08-08 ENCOUNTER — Encounter: Payer: Self-pay | Admitting: Internal Medicine

## 2014-08-08 DIAGNOSIS — R195 Other fecal abnormalities: Secondary | ICD-10-CM

## 2014-08-08 MED ORDER — SODIUM CHLORIDE 0.9 % IV SOLN: 500.0000 mL | INTRAVENOUS | Status: DC

## 2014-08-08 NOTE — Patient Instructions (Signed)
Discharge instructions given with verbal understanding. Normal exam. Resume previous medications. YOU HAD AN ENDOSCOPIC PROCEDURE TODAY AT THE [facility]: Refer to the procedure report that was given to you for any specific questions about what was found during the examination.  If the procedure report does not answer your questions, please call your gastroenterologist to clarify.  If you requested that your care partner not be given the details of your procedure findings, then the procedure report has been included in a sealed envelope for you to review at your convenience later.  YOU SHOULD EXPECT: Some feelings of bloating in the abdomen. Passage of more gas than usual.  Walking can help get rid of the air that was put into your GI tract during the procedure and reduce the bloating. If you had a lower endoscopy (such as a colonoscopy or flexible sigmoidoscopy) you may notice spotting of blood in your stool or on the toilet paper. If you underwent a bowel prep for your procedure, then you may not have a normal bowel movement for a few days.  DIET: Your first meal following the procedure should be a light meal and then it is ok to progress to your normal diet.  A half-sandwich or bowl of soup is an example of a good first meal.  Heavy or fried foods are harder to digest and may make you feel nauseous or bloated.  Likewise meals heavy in dairy and vegetables can cause extra gas to form and this can also increase the bloating.  Drink plenty of fluids but you should avoid alcoholic beverages for 24 hours.  ACTIVITY: Your care partner should take you home directly after the procedure.  You should plan to take it easy, moving slowly for the rest of the day.  You can resume normal activity the day after the procedure however you should NOT DRIVE or use heavy machinery for 24 hours (because of the sedation medicines used during the test).    SYMPTOMS TO REPORT IMMEDIATELY: A gastroenterologist  can be reached at any hour.  During normal business hours, 8:30 AM to 5:00 PM Monday through Friday, call [phone].  After hours and on weekends, please call the GI answering service at [phone] who will take a message and have the physician on call contact you.   Following upper endoscopy (EGD)  Vomiting of blood or coffee ground material  New chest pain or pain under the shoulder blades  Painful or persistently difficult swallowing  New shortness of breath  Fever of 100F or higher  Black, tarry-looking stools  FOLLOW UP: If any biopsies were taken you will be contacted by phone or by letter within the next 1-3 weeks.  Call your gastroenterologist if you have not heard about the biopsies in 3 weeks.  Our staff will call the home number listed on your records the next business day following your procedure to check on you and address any questions or concerns that you may have at that time regarding the information given to you following your procedure. This is a courtesy call and so if there is no answer at the home number and we have not heard from you through the emergency physician on call, we will assume that you have returned to your regular daily activities without incident.  SIGNATURES/CONFIDENTIALITY: You and/or your care partner have signed paperwork which will be entered into your electronic medical record.  These signatures attest to the fact that that the information above on your After   Visit Summary has been reviewed and is understood.  Full responsibility of the confidentiality of this discharge information lies with you and/or your care-partner. 

## 2014-08-08 NOTE — Op Note (Signed)
[place]  [address]. [place], [id number]   ENDOSCOPY PROCEDURE REPORT  PATIENT: Krystal Hopkins, Krystal Hopkins  MR#: [id number] BIRTHDATE: [birth year] , 82  yrs. old GENDER: female ENDOSCOPIST: Gatha Mayer, MD, [facility] PROCEDURE DATE:  08/08/2014 PROCEDURE:  EGD, diagnostic ASA CLASS:     Class III INDICATIONS:  hemocult positive stool. MEDICATIONS: Propofol 130 mg and Lidocaine 120 mg  IV and Monitored anesthesia care TOPICAL ANESTHETIC: none  DESCRIPTION OF PROCEDURE: After the risks benefits and alternatives of the procedure were thoroughly explained, informed consent was obtained.  The LB [id number] endoscope was introduced through the mouth and advanced to the second portion of the duodenum , Without limitations.  The instrument was slowly withdrawn as the mucosa was fully examined.      EXAM: The esophagus and gastroesophageal junction were completely normal in appearance.  The stomach was entered and closely examined.The antrum, angularis, and lesser curvature were well visualized, including a retroflexed view of the cardia and fundus. The stomach wall was normally distensable.  The scope passed easily through the pylorus into the duodenum.  Retroflexed views revealed no abnormalities.     The scope was then withdrawn from the patient and the procedure completed.  COMPLICATIONS: There were no immediate complications.  ENDOSCOPIC IMPRESSION: Normal appearing esophagus and GE junction, the stomach was well visualized and normal in appearance, normal appearing duodenum  RECOMMENDATIONS: No further GI work-up and would not do routine hemoccults in future. Colonoscopy last year with hemorrhoids (possible cause of heme+) and 2 small benign polyps  REPEAT EXAM:  eSigned:  Gatha Mayer, MD, [facility] 08/08/2014 2:39 PM    CC: Eldridge Abrahams, NP and The Patient

## 2014-08-08 NOTE — Progress Notes (Signed)
Report to PACU, RN, vss, BBS= Clear.  

## 2014-08-09 ENCOUNTER — Telehealth: Payer: Self-pay | Admitting: *Deleted

## 2014-08-09 NOTE — Telephone Encounter (Signed)
  Follow up Call-  Call back number 08/08/2014 11/08/2012  Post procedure Call Back phone  # [phone]  Permission to leave phone message Yes Yes     Patient questions:  Do you have a fever, pain , or abdominal swelling? No. Pain Score  0 *  Have you tolerated food without any problems? Yes.    Have you been able to return to your normal activities? Yes.    Do you have any questions about your discharge instructions: Diet   No. Medications  No. Follow up visit  No.  Do you have questions or concerns about your Care? No.  Actions: * If pain score is 4 or above: No action needed, pain <4.

## 2016-01-20 ENCOUNTER — Encounter (HOSPITAL_COMMUNITY)
Admission: RE | Admit: 2016-01-20 | Discharge: 2016-01-20 | Disposition: A | Discharge status: Home / Self Care | Payer: Medicare Other | Source: Ambulatory Visit | Attending: Cardiology | Admitting: Cardiology

## 2016-01-20 VITALS — BP 144/88 | HR 66 | Ht 62.0 in | Wt 173.5 lb

## 2016-01-20 DIAGNOSIS — R0602 Shortness of breath: Secondary | ICD-10-CM | POA: Diagnosis present

## 2016-01-20 NOTE — Progress Notes (Signed)
Krystal Hopkins 82 y.o. female Pulmonary Rehab Orientation Note Patient arrived today in Cardiac and Pulmonary Rehab for orientation to Pulmonary Rehab. She was transported from [employer] via wheel chair. She does not carry portable oxygen. Per pt, she uses oxygen never. Color good, skin warm and dry. Patient is oriented to time and place. Patient's medical history, psychosocial health, and medications reviewed. Psychosocial assessment reveals pt lives with their daughter. Pt is currently retired. Pt hobbies include going to the movies and performing arts performances.  She attends with her daughter and a close friend.  Her close friend died 1 week ago and she will greatly miss her.  Pt reports her stress level is low. Areas of stress/anxiety include Health  She notices as she ages she slows down more every year and becomes more short of breath.  She has no major health problems, only a decrease in energy and more shortness of breath.  Pt does not exhibit  signs of depression. PHQ2/9 score 0/0. Pt shows good  coping skills with positive outlook . Will continue to monitor and evaluate progress toward psychosocial goal(s) of increasing her strength, stamina, and decreasing her shortness of breath. Physical assessment reveals heart rate is normal, breath sounds clear to auscultation, no wheezes, rales, or rhonchi. Grip strength equal, strong. Distal pulses 2+ bilateral posterior tibial pulses present with 2+ bilateral peripheral edema.  She has been started on valsartan which has a HCTZ component.  Hopefully this might decrease her peripheral edema. . Patient reports she does take medications as prescribed. Patient states she follows a Regular diet. The patient reports no specific efforts to gain or lose weight.. When she was in the program 3 years ago she lost 30 pounds and her weight has p;ateaued.  Dr. Einar Gip would like her to lose 20 pounds in hopes this might decrease her shortness of breath.  She feels  this is unrealistic, but if she loses anything it would be a added plus.   Patient's weight will be monitored closely. Demonstration and practice of PLB using pulse oximeter. Patient able to return demonstration satisfactorily. Safety and hand hygiene in the exercise area reviewed with patient. Patient voices understanding of the information reviewed. Department expectations discussed with patient and achievable goals were set. The patient shows enthusiasm about attending the program and we look forward to working with this nice lady. The patient is scheduled for a 6 min walk test on Thursday, January 23, 2016 @ 3:30pm and to begin exercise on Thursday, January 30, 2016 in the 1:30pm class. 1200-1410.

## 2016-01-23 ENCOUNTER — Encounter (HOSPITAL_COMMUNITY)
Admission: RE | Admit: 2016-01-23 | Discharge: 2016-01-23 | Disposition: A | Discharge status: Home / Self Care | Payer: Medicare Other | Source: Ambulatory Visit | Attending: Cardiology | Admitting: Cardiology

## 2016-01-23 DIAGNOSIS — R0602 Shortness of breath: Secondary | ICD-10-CM | POA: Diagnosis not present

## 2016-01-30 ENCOUNTER — Encounter (HOSPITAL_COMMUNITY)
Admission: RE | Admit: 2016-01-30 | Discharge: 2016-01-30 | Disposition: A | Discharge status: Home / Self Care | Payer: Medicare Other | Source: Ambulatory Visit | Attending: Pulmonary Disease | Admitting: Pulmonary Disease

## 2016-01-30 VITALS — Wt 170.6 lb

## 2016-01-30 DIAGNOSIS — R0602 Shortness of breath: Secondary | ICD-10-CM

## 2016-01-30 NOTE — Progress Notes (Signed)
Daily Session Note  Patient Details  Name: Krystal Hopkins MRN: [id number] Date of Birth: 1932-02-16 Referring Provider:        Pulmonary Rehab Walk Test from 01/23/2016 in [place]   Referring Provider  Dr. Einar Gip      Encounter Date: 01/30/2016  Check In:     Session Check In - 01/30/16 1330    Check-In   Location [facility]   Staff Present Rosebud Poles, RN, BSN;Molly diVincenzo, MS, ACSM RCEP, Exercise Physiologist;Lisa Ysidro Evert, RN   Supervising physician immediately available to respond to emergencies [facility] immediately available   Physician(s) Dr. Eliseo Squires   Medication changes reported     No   Fall or balance concerns reported    No   Warm-up and Cool-down Performed as group-led instruction   Resistance Training Performed Yes   VAD Patient? No   Pain Assessment   Currently in Pain? No/denies   Multiple Pain Sites No      Capillary Blood Glucose: No results found for this or any previous visit (from the past 24 hour(s)).      Exercise Prescription Changes - 01/30/16 1500    Exercise Review   Progression Yes   Response to Exercise   Blood Pressure (Admit) 110/60 mmHg   Blood Pressure (Exercise) 116/60 mmHg   Blood Pressure (Exit) 112/60 mmHg   Heart Rate (Admit) 76 bpm   Heart Rate (Exercise) 95 bpm   Heart Rate (Exit) 81 bpm   Oxygen Saturation (Admit) 99 %   Oxygen Saturation (Exercise) 96 %   Oxygen Saturation (Exit) 97 %   Rating of Perceived Exertion (Exercise) 11   Perceived Dyspnea (Exercise) 1   Progression   Progression Continue to progress workloads to maintain intensity without signs/symptoms of physical distress.   Resistance Training   Training Prescription Yes   Weight orange band   Reps 10-12   Interval Training   Interval Training No   Bike   Level 3   Minutes 15   NuStep   Level 2   Minutes 15   METs 2.4   Track   Laps 12   Minutes 15     Goals Met:  Exercise  tolerated well Strength training completed today  Goals Unmet:  Not Applicable  Comments: Service time is from 1330 to 1445   Dr. Rush Farmer is Medical Director for Pulmonary Rehab at [facility].

## 2016-02-04 ENCOUNTER — Encounter (HOSPITAL_COMMUNITY): Payer: Medicare Other

## 2016-02-06 ENCOUNTER — Encounter (HOSPITAL_COMMUNITY)
Admission: RE | Admit: 2016-02-06 | Discharge: 2016-02-06 | Disposition: A | Discharge status: Home / Self Care | Payer: Medicare Other | Source: Ambulatory Visit | Attending: Pulmonary Disease | Admitting: Pulmonary Disease

## 2016-02-06 DIAGNOSIS — R0602 Shortness of breath: Secondary | ICD-10-CM | POA: Diagnosis not present

## 2016-02-06 NOTE — Progress Notes (Signed)
I have reviewed a Home Exercise Prescription with Krystal Hopkins . Krystal Hopkins is currently exercising at home.  The patient was advised to walk 3 days a week for 30 minutes.  Krystal Hopkins and I discussed how to progress their exercise prescription.  The patient stated that their goal was to build stamina.  The patient stated that they understand the exercise prescription.  We reviewed exercise guidelines, target heart rate during exercise, oxygen use, weather, home pulse oximeter, endpoints for exercise, and goals.  Patient is encouraged to come to me with any questions. I will continue to follow up with the patient to assist them with progression and safety.

## 2016-02-11 ENCOUNTER — Encounter (HOSPITAL_COMMUNITY)
Admission: RE | Admit: 2016-02-11 | Discharge: 2016-02-11 | Disposition: A | Discharge status: Home / Self Care | Payer: Medicare Other | Source: Ambulatory Visit | Attending: Pulmonary Disease | Admitting: Pulmonary Disease

## 2016-02-11 VITALS — Wt 166.9 lb

## 2016-02-11 DIAGNOSIS — R0602 Shortness of breath: Secondary | ICD-10-CM | POA: Diagnosis not present

## 2016-02-11 NOTE — Progress Notes (Signed)
Daily Session Note  Patient Details  Name: Krystal Hopkins MRN: [id number] Date of Birth: [birth year] Referring Provider:        Pulmonary Rehab Walk Test from 01/23/2016 in [place]   Referring Provider  Dr. Einar Gip      Encounter Date: 02/11/2016  Check In:     Session Check In - 02/11/16 1551    Check-In   Location [facility]   Staff Present Rosebud Poles, RN, BSN;Molly diVincenzo, MS, ACSM RCEP, Exercise Physiologist;Portia Rollene Rotunda, RN, BSN   Supervising physician immediately available to respond to emergencies [facility] immediately available   Physician(s) Dr. Marily Memos   Medication changes reported     No   Fall or balance concerns reported    No   Warm-up and Cool-down Performed as group-led instruction   Resistance Training Performed Yes   VAD Patient? No   Pain Assessment   Currently in Pain? No/denies   Multiple Pain Sites No      Capillary Blood Glucose: No results found for this or any previous visit (from the past 24 hour(s)).      Exercise Prescription Changes - 02/11/16 1500    Exercise Review   Progression Yes   Response to Exercise   Blood Pressure (Admit) 128/70 mmHg   Blood Pressure (Exercise) 114/60 mmHg   Blood Pressure (Exit) 100/50 mmHg   Heart Rate (Admit) 62 bpm   Heart Rate (Exercise) 111 bpm   Heart Rate (Exit) 77 bpm   Oxygen Saturation (Admit) 96 %   Oxygen Saturation (Exercise) 97 %   Oxygen Saturation (Exit) 95 %   Rating of Perceived Exertion (Exercise) 13   Perceived Dyspnea (Exercise) 1   Duration Progress to 45 minutes of aerobic exercise without signs/symptoms of physical distress   Intensity THRR unchanged   Progression   Progression Continue to progress workloads to maintain intensity without signs/symptoms of physical distress.   Resistance Training   Training Prescription Yes   Weight orange bands'   Reps 10-12   Interval Training   Interval Training No   Bike   Level 3   Minutes 15   NuStep   Level 4   Minutes 15   METs 2.8   Track   Laps 13   Minutes 15     Goals Met:  Exercise tolerated well No report of cardiac concerns or symptoms Strength training completed today  Goals Unmet:  Not Applicable  Comments: Service time is from 1330 to 1515    Dr. Rush Farmer is Medical Director for Pulmonary Rehab at [facility].

## 2016-02-13 ENCOUNTER — Encounter (HOSPITAL_COMMUNITY)
Admission: RE | Admit: 2016-02-13 | Discharge: 2016-02-13 | Disposition: A | Discharge status: Home / Self Care | Payer: Medicare Other | Source: Ambulatory Visit | Attending: Pulmonary Disease | Admitting: Pulmonary Disease

## 2016-02-13 VITALS — Wt 167.5 lb

## 2016-02-13 DIAGNOSIS — R0602 Shortness of breath: Secondary | ICD-10-CM

## 2016-02-13 NOTE — Progress Notes (Signed)
Daily Session Note  Patient Details  Name: Krystal Hopkins MRN: [id number] Date of Birth: 06/24/1932 Referring Provider:        Pulmonary Rehab Walk Test from 01/23/2016 in [place]   Referring Provider  Dr. Einar Gip      Encounter Date: 02/13/2016  Check In:     Session Check In - 02/13/16 1336    Check-In   Location [facility]   Staff Present Su Hilt, MS, ACSM RCEP, Exercise Physiologist;Joan Leonia Reeves, RN, Luisa Hart, RN, BSN   Supervising physician immediately available to respond to emergencies [facility] immediately available   Physician(s) Dr. Marily Memos   Medication changes reported     No   Fall or balance concerns reported    No   Resistance Training Performed Yes   VAD Patient? No   Pain Assessment   Currently in Pain? No/denies   Multiple Pain Sites No      Capillary Blood Glucose: No results found for this or any previous visit (from the past 24 hour(s)).      Exercise Prescription Changes - 02/13/16 1645    Response to Exercise   Blood Pressure (Admit) 108/60 mmHg   Blood Pressure (Exercise) 100/60 mmHg   Blood Pressure (Exit) 106/84 mmHg   Heart Rate (Admit) 66 bpm   Heart Rate (Exercise) 91 bpm   Heart Rate (Exit) 65 bpm   Oxygen Saturation (Admit) 98 %   Oxygen Saturation (Exercise) 99 %   Oxygen Saturation (Exit) 100 %   Rating of Perceived Exertion (Exercise) 13   Perceived Dyspnea (Exercise) 1   Duration Progress to 45 minutes of aerobic exercise without signs/symptoms of physical distress   Intensity THRR unchanged   Progression   Progression Continue to progress workloads to maintain intensity without signs/symptoms of physical distress.   Resistance Training   Training Prescription Yes   Weight orange bands'   Reps 10-12   Interval Training   Interval Training No   Bike   Level 3   Minutes 15   NuStep   Level 4   Minutes 15   METs 2.9     Goals Met:  Improved  SOB with ADL's Using PLB without cueing & demonstrates good technique Exercise tolerated well No report of cardiac concerns or symptoms Strength training completed today  Goals Unmet:  Not Applicable  Comments: Service time is from 1330 to 1545   Dr. Rush Farmer is Medical Director for Pulmonary Rehab at [facility].

## 2016-02-18 ENCOUNTER — Encounter (HOSPITAL_COMMUNITY): Payer: Self-pay | Admitting: *Deleted

## 2016-02-18 ENCOUNTER — Encounter (HOSPITAL_COMMUNITY): Payer: Medicare Other

## 2016-02-20 ENCOUNTER — Encounter (HOSPITAL_COMMUNITY)
Admission: RE | Admit: 2016-02-20 | Discharge: 2016-02-20 | Disposition: A | Discharge status: Home / Self Care | Payer: Medicare Other | Source: Ambulatory Visit | Attending: Pulmonary Disease | Admitting: Pulmonary Disease

## 2016-02-20 DIAGNOSIS — R0602 Shortness of breath: Secondary | ICD-10-CM | POA: Diagnosis not present

## 2016-02-20 NOTE — Progress Notes (Signed)
Krystal Hopkins 82 y.o. female Nutrition Note Spoke with pt. Pt is obese. Per nutrition screen, pt wants to lose wt. Per discussion, "Dr. Einar Gip wants me to lose 20 lb." Pt is trying to lose wt "but I don't know if I'll be able to do it." There are some ways the pt can make her eating habits healthier.  Pt's Rate Your Plate results reviewed with pt. Pt unable to avoid salty food due to frequency of eating out; does not use canned/ convenience food.  Pt does not add salt to food at the table. The role of sodium in lung disease reviewed with pt. Pt expressed understanding of the information reviewed via feedback method.   No results found for: HGBA1C  Nutrition Diagnosis ? Food-and nutrition-related knowledge deficit related to lack of exposure to information as related to diagnosis of pulmonary disease ? Obesity related to excessive energy intake as evidenced by a BMI of 31.7 ?  Nutrition Rx/Est. Daily Nutrition Needs for: ? wt loss 1200-1300 Kcal  75-80 gm protein   1500 mg or less sodium Nutrition Intervention ? Pt's individual nutrition plan and goals reviewed with pt. ? Benefits of adopting healthy eating habits discussed when pt's Rate Your Plate reviewed. ? Pt to attend the Nutrition and Lung Disease class ? Handouts given for Nutrition II class (Making healthier choices when eating out) ? Continual client-centered nutrition education by RD, as part of interdisciplinary care. Goal(s) 1. Identify food quantities necessary to achieve wt loss of  -2# per week to a goal wt loss of 2.7-10.9 kg (6-24 lb) at graduation from pulmonary rehab. Monitor and Evaluate progress toward nutrition goal with team.   Derek Mound, M.Ed, RD, LDN, CDE 02/20/2016 3:25 PM

## 2016-02-20 NOTE — Progress Notes (Signed)
Daily Session Note  Patient Details  Name: Krystal Hopkins MRN: [id number] Date of Birth: 1932/09/25 Referring Provider:        Pulmonary Rehab Walk Test from 01/23/2016 in [place]   Referring Provider  Dr. Einar Gip      Encounter Date: 02/20/2016  Check In:     Session Check In - 02/20/16 1356    Check-In   Location [facility]   Staff Present Rosebud Poles, RN, BSN;Lisa Ysidro Evert, RN;Portia Rollene Rotunda, RN, BSN;Adelene Polivka, MS, ACSM RCEP, Exercise Physiologist   Supervising physician immediately available to respond to emergencies [facility] immediately available   Physician(s) Dr. Waldron Labs   Medication changes reported     No   Fall or balance concerns reported    Yes  she fell at home 02/18/16, not sure of how it happened.  We to PCP for evaluation   Warm-up and Cool-down Performed as group-led instruction   Resistance Training Performed Yes   VAD Patient? No   Pain Assessment   Currently in Pain? No/denies   Multiple Pain Sites No      Capillary Blood Glucose: No results found for this or any previous visit (from the past 24 hour(s)).      Exercise Prescription Changes - 02/20/16 1600    Response to Exercise   Blood Pressure (Admit) 118/78 mmHg   Blood Pressure (Exercise) 100/60 mmHg   Blood Pressure (Exit) 94/56 mmHg   Heart Rate (Admit) 72 bpm   Heart Rate (Exercise) 92 bpm   Heart Rate (Exit) 78 bpm   Oxygen Saturation (Admit) 99 %   Oxygen Saturation (Exercise) 98 %   Oxygen Saturation (Exit) 97 %   Rating of Perceived Exertion (Exercise) 11   Perceived Dyspnea (Exercise) 1   Duration Progress to 45 minutes of aerobic exercise without signs/symptoms of physical distress   Intensity THRR unchanged   Progression   Progression Continue to progress workloads to maintain intensity without signs/symptoms of physical distress.   Resistance Training   Training Prescription Yes   Weight orange bands'   Reps  10-12   Interval Training   Interval Training No   NuStep   Level 4   Minutes 15   METs 2.7   Track   Laps 14   Minutes 15     Goals Met:  Exercise tolerated well No report of cardiac concerns or symptoms Strength training completed today  Goals Unmet:  Not Applicable  Comments: Service time is from 1:30pm to 3:15pm    Dr. Rush Farmer is Medical Director for Pulmonary Rehab at [facility].

## 2016-02-20 NOTE — Progress Notes (Signed)
Pulmonary Individual Treatment Plan  Patient Details  Name: Krystal Hopkins MRN: [id number] Date of Birth: 05-23-1932 Referring Provider:        Pulmonary Rehab Walk Test from 01/23/2016 in [place]   Referring Provider  Dr. Einar Gip      Initial Encounter Date:       Pulmonary Rehab Walk Test from 01/23/2016 in [place]   Date  01/27/16   Referring Provider  Dr. Einar Gip      Visit Diagnosis: No diagnosis found.  Patient's Home Medications on Admission:   Current outpatient prescriptions:  .  aspirin EC 81 MG tablet, Take 81 mg by mouth every morning., Disp: , Rfl:  .  BENICAR HCT 20-12.5 MG per tablet, Take 1 tablet by mouth daily. Reported on 01/20/2016, Disp: , Rfl:  .  Calcium Carbonate (CALCIUM 600 PO), Take 1 tablet by mouth daily., Disp: , Rfl:  .  cetirizine (ZYRTEC) 10 MG tablet, Take 10 mg by mouth daily., Disp: , Rfl:  .  levothyroxine (SYNTHROID, LEVOTHROID) 100 MCG tablet, Take 100 mcg by mouth every morning., Disp: , Rfl:  .  Multiple Vitamin (MULITIVITAMIN WITH MINERALS) TABS, Take 1 tablet by mouth every morning., Disp: , Rfl:  .  Omega-3 Fatty Acids (FISH OIL PO), Take 1 tablet by mouth daily., Disp: , Rfl:  .  pravastatin (PRAVACHOL) 20 MG tablet, Take 20 mg by mouth daily., Disp: , Rfl:  .  valsartan-hydrochlorothiazide (DIOVAN-HCT) 80-12.5 MG tablet, Take 1 tablet by mouth daily., Disp: , Rfl:  .  vitamin C (ASCORBIC ACID) 500 MG tablet, Take 500 mg by mouth daily. Reported on 01/20/2016, Disp: , Rfl:  .  VITAMIN D, CHOLECALCIFEROL, PO, Take 1 tablet by mouth daily., Disp: , Rfl:  .  vitamin E 100 UNIT capsule, Take 100 Units by mouth daily. Reported on 01/20/2016, Disp: , Rfl:   Past Medical History: Past Medical History  Diagnosis Date  . Hypertension   . Blood transfusion without reported diagnosis   . Cataract   . Hyperlipidemia   . Osteoporosis   . Hypothyroidism   . Colon polyp 2014   TUBULAR ADENOMA.    Tobacco Use: History  Smoking status  . Former Smoker -- 1.00 packs/day for 20 years  . Types: Cigarettes  . Quit date: 10/24/1970  Smokeless tobacco  . Never Used    Labs: Recent Review Flowsheet Data    There is no flowsheet data to display.      Capillary Blood Glucose: No results found for: GLUCAP   ADL UCSD:     Pulmonary Assessment Scores      01/30/16 1605       ADL UCSD   ADL Phase Entry     SOB Score total 31        Pulmonary Function Assessment:     Pulmonary Function Assessment - 01/20/16 1247    Breath   Bilateral Breath Sounds Clear   Shortness of Breath Yes;Fear of Shortness of Breath;Limiting activity;Panic with Shortness of Breath      Exercise Target Goals:    Exercise Program Goal: Individual exercise prescription set with THRR, safety & activity barriers. Participant demonstrates ability to understand and report RPE using BORG scale, to self-measure pulse accurately, and to acknowledge the importance of the exercise prescription.  Exercise Prescription Goal: Starting with aerobic activity 30 plus minutes a day, 3 days per week for initial exercise prescription. Provide home exercise prescription and  guidelines that participant acknowledges understanding prior to discharge.  Activity Barriers & Risk Stratification:     Activity Barriers & Cardiac Risk Stratification - 01/20/16 1246    Activity Barriers & Cardiac Risk Stratification   Activity Barriers Joint Problems;Deconditioning;Muscular Weakness;Shortness of Breath      6 Minute Walk:     6 Minute Walk      01/27/16 0928       6 Minute Walk   Phase Initial     Distance 1200 feet     Walk Time 6 minutes     # of Rest Breaks 0     MPH 2.27     METS 1.89     RPE 12     Perceived Dyspnea  1     VO2 Peak 6.6     Symptoms No     Resting HR 80 bpm     Resting BP 108/80 mmHg     Max Ex. HR 101 bpm     Max Ex. BP 140/72 mmHg     Interval HR    Baseline HR 80     2 Minute HR 99     4 Minute HR 100     6 Minute HR 99     Interval Heart Rate? Yes     Interval Oxygen   Baseline Oxygen Saturation % 99 %     Baseline Liters of Oxygen 0 L     1 Minute Oxygen Saturation % 99 %     1 Minute Liters of Oxygen 0 L     2 Minute Oxygen Saturation % 99 %     2 Minute Liters of Oxygen 0 L     3 Minute Oxygen Saturation % 99 %     3 Minute Liters of Oxygen 0 L     4 Minute Oxygen Saturation % 99 %     4 Minute Liters of Oxygen 0 L     5 Minute Oxygen Saturation % 98 %     5 Minute Liters of Oxygen 0 L     6 Minute Oxygen Saturation % 99 %     6 Minute Liters of Oxygen 0 L     2 Minute Post Oxygen Saturation % 98 %     2 Minute Post Liters of Oxygen 0 L        Initial Exercise Prescription:     Initial Exercise Prescription - 01/27/16 0900    Date of Initial Exercise RX and Referring Provider   Date 01/27/16   Referring Provider Dr. Einar Gip   Oxygen   Oxygen --  room air   Bike   Level 2   Minutes 15   NuStep   Level 2   Minutes 15   METs 1.5   Track   Laps 11   Minutes 15   Prescription Details   Frequency (times per week) 2   Duration Progress to 45 minutes of aerobic exercise without signs/symptoms of physical distress   Intensity   THRR 40-80% of Max Heartrate 56-111   Ratings of Perceived Exertion 11-13   Perceived Dyspnea 0-4   Progression   Progression Continue progressive overload as per policy without signs/symptoms or physical distress.   Resistance Training   Training Prescription Yes   Weight orange band   Reps 10-12      Perform Capillary Blood Glucose checks as needed.  Exercise Prescription Changes:     Exercise Prescription Changes  01/30/16 1500 02/06/16 1600 02/11/16 1500 02/13/16 1645     Exercise Review   Progression Yes Yes Yes     Response to Exercise   Blood Pressure (Admit) 110/60 mmHg 110/60 mmHg 128/70 mmHg 108/60 mmHg    Blood Pressure (Exercise) 116/60 mmHg 116/60 mmHg  114/60 mmHg 100/60 mmHg    Blood Pressure (Exit) 112/60 mmHg 112/60 mmHg 100/50 mmHg 106/84 mmHg    Heart Rate (Admit) 76 bpm 76 bpm 62 bpm 66 bpm    Heart Rate (Exercise) 95 bpm 95 bpm 111 bpm 91 bpm    Heart Rate (Exit) 81 bpm 81 bpm 77 bpm 65 bpm    Oxygen Saturation (Admit) 99 % 99 % 96 % 98 %    Oxygen Saturation (Exercise) 96 % 96 % 97 % 99 %    Oxygen Saturation (Exit) 97 % 97 % 95 % 100 %    Rating of Perceived Exertion (Exercise) 11 11 13 13     Perceived Dyspnea (Exercise) 1 1 1 1     Comments  reviewed home exercise prescription with patient      Duration   Progress to 45 minutes of aerobic exercise without signs/symptoms of physical distress Progress to 45 minutes of aerobic exercise without signs/symptoms of physical distress    Intensity   THRR unchanged THRR unchanged    Progression   Progression Continue to progress workloads to maintain intensity without signs/symptoms of physical distress. Continue to progress workloads to maintain intensity without signs/symptoms of physical distress. Continue to progress workloads to maintain intensity without signs/symptoms of physical distress. Continue to progress workloads to maintain intensity without signs/symptoms of physical distress.    Resistance Training   Training Prescription Yes Yes Yes Yes    Weight orange band orange band orange bands' orange bands'    Reps 10-12 10-12 10-12 10-12    Interval Training   Interval Training No No No No    Bike   Level 3 3 3 3     Minutes 15 15 15 15     NuStep   Level 2 2 4 4     Minutes 15 15 15 15     METs 2.4 2.4 2.8 2.9    Track   Laps 12 12 13      Minutes 15 15 15         Exercise Comments:     Exercise Comments      02/06/16 1623 02/20/16 0944         Exercise Comments reviewed home exercise prescription with patient Patient has only had 4 exercise sessions but is progressing. Will continue to monitor and progress as appropriate.         Discharge Exercise Prescription  (Final Exercise Prescription Changes):     Exercise Prescription Changes - 02/13/16 1645    Response to Exercise   Blood Pressure (Admit) 108/60 mmHg   Blood Pressure (Exercise) 100/60 mmHg   Blood Pressure (Exit) 106/84 mmHg   Heart Rate (Admit) 66 bpm   Heart Rate (Exercise) 91 bpm   Heart Rate (Exit) 65 bpm   Oxygen Saturation (Admit) 98 %   Oxygen Saturation (Exercise) 99 %   Oxygen Saturation (Exit) 100 %   Rating of Perceived Exertion (Exercise) 13   Perceived Dyspnea (Exercise) 1   Duration Progress to 45 minutes of aerobic exercise without signs/symptoms of physical distress   Intensity THRR unchanged   Progression   Progression Continue to progress workloads to maintain intensity without signs/symptoms of physical distress.  Resistance Training   Training Prescription Yes   Weight orange bands'   Reps 10-12   Interval Training   Interval Training No   Bike   Level 3   Minutes 15   NuStep   Level 4   Minutes 15   METs 2.9       Nutrition:  Target Goals: Understanding of nutrition guidelines, daily intake of sodium 1500mg , cholesterol 200mg , calories 30% from fat and 7% or less from saturated fats, daily to have 5 or more servings of fruits and vegetables.  Biometrics:     Pre Biometrics - 01/20/16 1255    Pre Biometrics   Grip Strength 18 kg       Nutrition Therapy Plan and Nutrition Goals:   Nutrition Discharge: Rate Your Plate Scores:   Psychosocial: Target Goals: Acknowledge presence or absence of depression, maximize coping skills, provide positive support system. Participant is able to verbalize types and ability to use techniques and skills needed for reducing stress and depression.  Initial Review & Psychosocial Screening:     Initial Psych Review & Screening - 01/20/16 1308    Family Dynamics   Good Support System? Yes   Comments --  daughter and grandson are VERY supportive   Barriers   Psychosocial barriers to participate in  program There are no identifiable barriers or psychosocial needs.   Screening Interventions   Interventions Encouraged to exercise      Quality of Life Scores:     Quality of Life - 01/30/16 1604    Quality of Life Scores   Health/Function Pre 26.14 %   Socioeconomic Pre 30 %   Psych/Spiritual Pre 25.14 %   Family Pre 25.38 %   GLOBAL Pre 26.67 %      PHQ-9:     Recent Review Flowsheet Data    Depression screen [facility] 2/9 01/20/2016 05/03/2013   Decreased Interest 0 0   Down, Depressed, Hopeless 0 1   PHQ - 2 Score 0 1      Psychosocial Evaluation and Intervention:     Psychosocial Evaluation - 01/20/16 1309    Psychosocial Evaluation & Interventions   Interventions Encouraged to exercise with the program and follow exercise prescription   Comments Good psychological outlook.   Continued Psychosocial Services Needed No      Psychosocial Re-Evaluation:     Psychosocial Re-Evaluation      02/18/16 1612           Psychosocial Re-Evaluation   Interventions Encouraged to attend Pulmonary Rehabilitation for the exercise  none needed       Comments good support system, no intervention needed       Continued Psychosocial Services Needed No         Education: Education Goals: Education classes will be provided on a weekly basis, covering required topics. Participant will state understanding/return demonstration of topics presented.  Learning Barriers/Preferences:     Learning Barriers/Preferences - 01/20/16 1246    Learning Barriers/Preferences   Learning Barriers None   Learning Preferences Audio;Group Instruction;Computer/Internet;Pictoral;Skilled Demonstration;Verbal Instruction;Video;Written Material;Individual Instruction      Education Topics: Risk Factor Reduction:  -Group instruction that is supported by a PowerPoint presentation. Instructor discusses the definition of a risk factor, different risk factors for pulmonary disease, and how the heart and  lungs work together.     Nutrition for Pulmonary Patient:  -Group instruction provided by PowerPoint slides, verbal discussion, and written materials to support subject matter. The instructor gives an explanation and  review of healthy diet recommendations, which includes a discussion on weight management, recommendations for fruit and vegetable consumption, as well as protein, fluid, caffeine, fiber, sodium, sugar, and alcohol. Tips for eating when patients are short of breath are discussed.   Pursed Lip Breathing:  -Group instruction that is supported by demonstration and informational handouts. Instructor discusses the benefits of pursed lip and diaphragmatic breathing and detailed demonstration on how to preform both.     Oxygen Safety:  -Group instruction provided by PowerPoint, verbal discussion, and written material to support subject matter. There is an overview of "What is Oxygen" and "Why do we need it".  Instructor also reviews how to create a safe environment for oxygen use, the importance of using oxygen as prescribed, and the risks of noncompliance. There is a brief discussion on traveling with oxygen and resources the patient may utilize.   Oxygen Equipment:  -Group instruction provided by [facility] Staff utilizing handouts, written materials, and equipment demonstrations.   Signs and Symptoms:  -Group instruction provided by written material and verbal discussion to support subject matter. Warning signs and symptoms of infection, stroke, and heart attack are reviewed and when to call the physician/911 reinforced. Tips for preventing the spread of infection discussed.   Advanced Directives:  -Group instruction provided by verbal instruction and written material to support subject matter. Instructor reviews Advanced Directive laws and proper instruction for filling out document.   Pulmonary Video:  -Group video education that reviews the importance of medication and oxygen  compliance, exercise, good nutrition, pulmonary hygiene, and pursed lip and diaphragmatic breathing for the pulmonary patient.   Exercise for the Pulmonary Patient:  -Group instruction that is supported by a PowerPoint presentation. Instructor discusses benefits of exercise, core components of exercise, frequency, duration, and intensity of an exercise routine, importance of utilizing pulse oximetry during exercise, safety while exercising, and options of places to exercise outside of rehab.     Pulmonary Medications:  -Verbally interactive group education provided by instructor with focus on inhaled medications and proper administration.   Anatomy and Physiology of the Respiratory System and Intimacy:  -Group instruction provided by PowerPoint, verbal discussion, and written material to support subject matter. Instructor reviews respiratory cycle and anatomical components of the respiratory system and their functions. Instructor also reviews differences in obstructive and restrictive respiratory diseases with examples of each. Intimacy, Sex, and Sexuality differences are reviewed with a discussion on how relationships can change when diagnosed with pulmonary disease. Common sexual concerns are reviewed.   Knowledge Questionnaire Score:     Knowledge Questionnaire Score - 01/30/16 1604    Knowledge Questionnaire Score   Pre Score 9/13      Core Components/Risk Factors/Patient Goals at Admission:     Personal Goals and Risk Factors at Admission - 01/20/16 1257    Core Components/Risk Factors/Patient Goals on Admission   Increase Strength and Stamina Yes   Intervention Provide advice, education, support and counseling about physical activity/exercise needs.;Develop an individualized exercise prescription for aerobic and resistive training based on initial evaluation findings, risk stratification, comorbidities and participant's personal goals.   Expected Outcomes Achievement of increased  cardiorespiratory fitness and enhanced flexibility, muscular endurance and strength shown through measurements of functional capacity and personal statement of participant.   Improve shortness of breath with ADL's Yes   Intervention Provide education, individualized exercise plan and daily activity instruction to help decrease symptoms of SOB with activities of daily living.   Expected Outcomes Short Term: Achieves a  reduction of symptoms when performing activities of daily living.   Develop more efficient breathing techniques such as purse lipped breathing and diaphragmatic breathing; and practicing self-pacing with activity Yes   Intervention Provide education, demonstration and support about specific breathing techniuqes utilized for more efficient breathing. Include techniques such as pursed lipped breathing, diaphragmatic breathing and self-pacing activity.   Expected Outcomes Short Term: Participant will be able to demonstrate and use breathing techniques as needed throughout daily activities.      Core Components/Risk Factors/Patient Goals Review:      Goals and Risk Factor Review      01/20/16 1304 02/18/16 1609         Core Components/Risk Factors/Patient Goals Review   Personal Goals Review Increase Strength and Stamina;Improve shortness of breath with ADL's       Review increase workloads as tolerated by patient Has only attended 4 exercise sessions.  She has not noticed an increase in strength and stamina as of yet.      Expected Outcomes increased strength, stamina and less shortness of breath with exercise and ADL's. increased strength and stamina with more time in the program         Core Components/Risk Factors/Patient Goals at Discharge (Final Review):      Goals and Risk Factor Review - 02/18/16 1609    Core Components/Risk Factors/Patient Goals Review   Review Has only attended 4 exercise sessions.  She has not noticed an increase in strength and stamina as of yet.    Expected Outcomes increased strength and stamina with more time in the program      ITP Comments:   Comments: .itpITP REVIEW Pt is making expected progress toward personal goals after completing 4sessions.   Recommend continued exercise, life style modification, education, and utilization of breathing techniques to increase stamina and strength and decrease shortness of breath with exertion.

## 2016-02-25 ENCOUNTER — Encounter (HOSPITAL_COMMUNITY)
Admission: RE | Admit: 2016-02-25 | Discharge: 2016-02-25 | Disposition: A | Discharge status: Home / Self Care | Payer: Medicare Other | Source: Ambulatory Visit | Attending: Pulmonary Disease | Admitting: Pulmonary Disease

## 2016-02-25 VITALS — Wt 169.3 lb

## 2016-02-25 DIAGNOSIS — R0602 Shortness of breath: Secondary | ICD-10-CM | POA: Diagnosis not present

## 2016-02-25 NOTE — Progress Notes (Signed)
Daily Session Note  Patient Details  Name: Krystal Hopkins MRN: [id number] Date of Birth: 1931/12/29 Referring Provider:        Pulmonary Rehab Walk Test from 01/23/2016 in [place]   Referring Provider  Dr. Einar Gip      Encounter Date: 02/25/2016  Check In:     Session Check In - 02/25/16 1548    Check-In   Location [facility]   Staff Present Trish Fountain, RN, BSN;Molly diVincenzo, MS, ACSM RCEP, Exercise Physiologist   Supervising physician immediately available to respond to emergencies [facility] immediately available   Physician(s) Dr. Marily Memos   Medication changes reported     No   Fall or balance concerns reported    No   Warm-up and Cool-down Performed as group-led instruction   Resistance Training Performed Yes   VAD Patient? No   Pain Assessment   Currently in Pain? No/denies   Multiple Pain Sites No      Capillary Blood Glucose: No results found for this or any previous visit (from the past 24 hour(s)).      Exercise Prescription Changes - 02/25/16 1551    Exercise Review   Progression Yes   Response to Exercise   Blood Pressure (Admit) 130/64 mmHg   Blood Pressure (Exercise) 120/60 mmHg   Blood Pressure (Exit) 110/62 mmHg   Heart Rate (Admit) 67 bpm   Heart Rate (Exercise) 88 bpm   Heart Rate (Exit) 67 bpm   Oxygen Saturation (Admit) 98 %   Oxygen Saturation (Exercise) 97 %   Oxygen Saturation (Exit) 97 %   Rating of Perceived Exertion (Exercise) 13   Perceived Dyspnea (Exercise) 3   Duration Progress to 45 minutes of aerobic exercise without signs/symptoms of physical distress   Intensity THRR unchanged   Progression   Progression Continue to progress workloads to maintain intensity without signs/symptoms of physical distress.   Resistance Training   Training Prescription Yes   Weight orange bands'   Reps 10-12   Interval Training   Interval Training No   Bike   Level 3   Minutes 15   NuStep   Level 5   Minutes 15   METs 2.1   Track   Laps 14   Minutes 15     Goals Met:  Improved SOB with ADL's Using PLB without cueing & demonstrates good technique Exercise tolerated well No report of cardiac concerns or symptoms Strength training completed today  Goals Unmet:  Not Applicable  Comments: Service time is from 1330 to 1500   Dr. Rush Farmer is Medical Director for Pulmonary Rehab at [facility].

## 2016-02-27 ENCOUNTER — Encounter (HOSPITAL_COMMUNITY)
Admission: RE | Admit: 2016-02-27 | Discharge: 2016-02-27 | Disposition: A | Discharge status: Home / Self Care | Payer: Medicare Other | Source: Ambulatory Visit | Attending: Pulmonary Disease | Admitting: Pulmonary Disease

## 2016-02-27 VITALS — Wt 165.1 lb

## 2016-02-27 DIAGNOSIS — R0602 Shortness of breath: Secondary | ICD-10-CM | POA: Diagnosis not present

## 2016-02-27 NOTE — Progress Notes (Signed)
Daily Session Note  Patient Details  Name: Krystal Hopkins MRN: [id number] Date of Birth: Jun 12, 1932 Referring Provider:        Pulmonary Rehab Walk Test from 01/23/2016 in [place]   Referring Provider  Dr. Einar Gip      Encounter Date: 02/27/2016  Check In:     Session Check In - 02/27/16 1437    Check-In   Location [facility]   Staff Present Rosebud Poles, RN, BSN;Molly diVincenzo, MS, ACSM RCEP, Exercise Physiologist;Sasan Wilkie Ysidro Evert, RN;Portia Rollene Rotunda, RN, BSN   Supervising physician immediately available to respond to emergencies [facility] immediately available   Physician(s) Dr. Eliseo Squires   Medication changes reported     No   Fall or balance concerns reported    No   Warm-up and Cool-down Performed as group-led instruction   Resistance Training Performed Yes   VAD Patient? No   Pain Assessment   Currently in Pain? No/denies   Multiple Pain Sites No      Capillary Blood Glucose: No results found for this or any previous visit (from the past 24 hour(s)).      Exercise Prescription Changes - 02/27/16 1500    Response to Exercise   Blood Pressure (Admit) 122/68 mmHg   Blood Pressure (Exercise) 130/60 mmHg   Blood Pressure (Exit) 114/64 mmHg   Heart Rate (Admit) 61 bpm   Heart Rate (Exercise) 108 bpm   Heart Rate (Exit) 73 bpm   Oxygen Saturation (Admit) 99 %   Oxygen Saturation (Exercise) 97 %   Oxygen Saturation (Exit) 100 %   Rating of Perceived Exertion (Exercise) 15   Perceived Dyspnea (Exercise) 3   Duration Progress to 45 minutes of aerobic exercise without signs/symptoms of physical distress   Intensity THRR unchanged   Progression   Progression Continue to progress workloads to maintain intensity without signs/symptoms of physical distress.   Resistance Training   Training Prescription Yes   Weight orange bands   Reps 10-12   Interval Training   Interval Training No   Bike   Level 3   Minutes 15   Track   Laps 13   Minutes 15     Goals Met:  Exercise tolerated well No report of cardiac concerns or symptoms Strength training completed today  Goals Unmet:  Not Applicable  Comments: Service time is from 1330 to 1515    Dr. Rush Farmer is Medical Director for Pulmonary Rehab at [facility].

## 2016-03-03 ENCOUNTER — Encounter (HOSPITAL_COMMUNITY)
Admission: RE | Admit: 2016-03-03 | Discharge: 2016-03-03 | Disposition: A | Discharge status: Home / Self Care | Payer: Medicare Other | Source: Ambulatory Visit | Attending: Pulmonary Disease | Admitting: Pulmonary Disease

## 2016-03-03 VITALS — Wt 166.0 lb

## 2016-03-03 DIAGNOSIS — R0602 Shortness of breath: Secondary | ICD-10-CM | POA: Diagnosis not present

## 2016-03-03 NOTE — Progress Notes (Signed)
Daily Session Note  Patient Details  Name: Krystal Hopkins MRN: [id number] Date of Birth: [birth year] Referring Provider:        Pulmonary Rehab Walk Test from 01/23/2016 in [place]   Referring Provider  Dr. Einar Gip      Encounter Date: 03/03/2016  Check In:     Session Check In - 03/03/16 1327    Check-In   Location [facility]   Staff Present Rosebud Poles, RN, Luisa Hart, RN, BSN;Ramon Dredge, RN, MHA;Ertha Nabor, MS, ACSM RCEP, Exercise Physiologist   Supervising physician immediately available to respond to emergencies [facility] immediately available   Physician(s) Dr. Marily Memos   Medication changes reported     No   Fall or balance concerns reported    No   Warm-up and Cool-down Performed as group-led instruction   Resistance Training Performed Yes   VAD Patient? No   Pain Assessment   Currently in Pain? No/denies   Multiple Pain Sites No      Capillary Blood Glucose: No results found for this or any previous visit (from the past 24 hour(s)).      Exercise Prescription Changes - 03/03/16 1500    Response to Exercise   Blood Pressure (Admit) 122/64 mmHg   Blood Pressure (Exercise) 132/62 mmHg   Blood Pressure (Exit) 98/62 mmHg   Heart Rate (Admit) 66 bpm   Heart Rate (Exercise) 97 bpm   Heart Rate (Exit) 71 bpm   Oxygen Saturation (Admit) 96 %   Oxygen Saturation (Exercise) 100 %   Oxygen Saturation (Exit) 97 %   Rating of Perceived Exertion (Exercise) 13   Perceived Dyspnea (Exercise) 2   Duration Progress to 45 minutes of aerobic exercise without signs/symptoms of physical distress   Intensity THRR unchanged   Progression   Progression Continue to progress workloads to maintain intensity without signs/symptoms of physical distress.   Resistance Training   Training Prescription Yes   Weight orange bands   Reps 10-12   Interval Training   Interval Training No   Bike   Level 3   Minutes 15   NuStep   Level 5   Minutes 15   METs 3.2   Track   Laps 13   Minutes 15     Goals Met:  Exercise tolerated well No report of cardiac concerns or symptoms Strength training completed today  Goals Unmet:  Not Applicable  Comments: Service time is from 1:30pm to 3:00pm    Dr. Rush Farmer is Medical Director for Pulmonary Rehab at [facility].

## 2016-03-05 ENCOUNTER — Encounter (HOSPITAL_COMMUNITY)
Admission: RE | Admit: 2016-03-05 | Discharge: 2016-03-05 | Disposition: A | Discharge status: Home / Self Care | Payer: Medicare Other | Source: Ambulatory Visit | Attending: Pulmonary Disease | Admitting: Pulmonary Disease

## 2016-03-05 DIAGNOSIS — R0602 Shortness of breath: Secondary | ICD-10-CM | POA: Insufficient documentation

## 2016-03-05 NOTE — Progress Notes (Signed)
Daily Session Note  Patient Details  Name: Krystal Hopkins MRN: [id number] Date of Birth: [birth year] Referring Provider:        Pulmonary Rehab Walk Test from 01/23/2016 in [place]   Referring Provider  Dr. Einar Gip      Encounter Date: 03/05/2016  Check In:     Session Check In - 03/05/16 1359    Check-In   Location [facility]   Staff Present Rosebud Poles, RN, BSN;Lisa Ysidro Evert, RN;Portia Rollene Rotunda, RN, BSN;Ramon Dredge, RN, MHA;Molly diVincenzo, MS, ACSM RCEP, Exercise Physiologist   Supervising physician immediately available to respond to emergencies [facility] immediately available   Physician(s) Dr. Renaee Munda   Medication changes reported     No   Fall or balance concerns reported    No   Warm-up and Cool-down Performed as group-led instruction   Resistance Training Performed Yes   VAD Patient? No   Pain Assessment   Currently in Pain? No/denies   Multiple Pain Sites No      Capillary Blood Glucose: No results found for this or any previous visit (from the past 24 hour(s)).      Exercise Prescription Changes - 03/05/16 1600    Response to Exercise   Blood Pressure (Admit) 108/60 mmHg   Blood Pressure (Exercise) 130/60 mmHg   Blood Pressure (Exit) 110/60 mmHg   Heart Rate (Admit) 71 bpm   Heart Rate (Exercise) 102 bpm   Heart Rate (Exit) 63 bpm   Oxygen Saturation (Admit) 99 %   Oxygen Saturation (Exercise) 95 %   Oxygen Saturation (Exit) 96 %   Rating of Perceived Exertion (Exercise) 11   Perceived Dyspnea (Exercise) 1   Duration Progress to 45 minutes of aerobic exercise without signs/symptoms of physical distress   Intensity THRR unchanged   Progression   Progression Continue to progress workloads to maintain intensity without signs/symptoms of physical distress.   Resistance Training   Training Prescription Yes   Weight orange bands   Reps 10-12   Interval Training   Interval Training No   NuStep    Level 5   Minutes 15   METs 3.3   Track   Laps 13   Minutes 15     Goals Met:  Exercise tolerated well Strength training completed today  Goals Unmet:  Not Applicable  Comments: Service time is from 1330 to 1530    Dr. Rush Farmer is Medical Director for Pulmonary Rehab at [facility].

## 2016-03-10 ENCOUNTER — Encounter (HOSPITAL_COMMUNITY)
Admission: RE | Admit: 2016-03-10 | Discharge: 2016-03-10 | Disposition: A | Discharge status: Home / Self Care | Payer: Medicare Other | Source: Ambulatory Visit | Attending: Pulmonary Disease | Admitting: Pulmonary Disease

## 2016-03-10 VITALS — Wt 169.3 lb

## 2016-03-10 DIAGNOSIS — R0602 Shortness of breath: Secondary | ICD-10-CM | POA: Diagnosis not present

## 2016-03-10 NOTE — Progress Notes (Signed)
Daily Session Note  Patient Details  Name: Krystal Hopkins MRN: [id number] Date of Birth: 11/16/1931 Referring Provider:        Pulmonary Rehab Walk Test from 01/23/2016 in [place]   Referring Provider  Dr. Einar Gip      Encounter Date: 03/10/2016  Check In:     Session Check In - 03/10/16 1323    Check-In   Location [facility]   Staff Present Rosebud Poles, RN, BSN;Donnie Panik Ysidro Evert, RN;Portia Rollene Rotunda, RN, BSN;Ramon Dredge, RN, MHA;Molly diVincenzo, MS, ACSM RCEP, Exercise Physiologist   Supervising physician immediately available to respond to emergencies [facility] immediately available   Physician(s) Dr. Marily Memos   Medication changes reported     No   Fall or balance concerns reported    No   Warm-up and Cool-down Performed as group-led instruction   Resistance Training Performed Yes   VAD Patient? No   Pain Assessment   Currently in Pain? No/denies   Multiple Pain Sites No      Capillary Blood Glucose: No results found for this or any previous visit (from the past 24 hour(s)).      Exercise Prescription Changes - 03/10/16 1500    Exercise Review   Progression Yes   Response to Exercise   Blood Pressure (Admit) 110/60 mmHg   Blood Pressure (Exercise) 130/68 mmHg   Blood Pressure (Exit) 134/72 mmHg   Heart Rate (Admit) 64 bpm   Heart Rate (Exercise) 99 bpm   Heart Rate (Exit) 71 bpm   Oxygen Saturation (Admit) 98 %   Oxygen Saturation (Exercise) 98 %   Oxygen Saturation (Exit) 97 %   Rating of Perceived Exertion (Exercise) 13   Perceived Dyspnea (Exercise) 2   Duration Progress to 45 minutes of aerobic exercise without signs/symptoms of physical distress   Intensity THRR unchanged   Progression   Progression Continue to progress workloads to maintain intensity without signs/symptoms of physical distress.   Resistance Training   Training Prescription Yes   Weight orange bands   Reps 10-12   Interval  Training   Interval Training No   Bike   Level 4   Minutes 15   NuStep   Level 6   Minutes 15   METs 3.1   Track   Laps 15   Minutes 15     Goals Met:  Exercise tolerated well No report of cardiac concerns or symptoms Strength training completed today  Goals Unmet:  Not Applicable  Comments: Service time is from 1330 to 1500    Dr. Rush Farmer is Medical Director for Pulmonary Rehab at [facility]

## 2016-03-12 ENCOUNTER — Encounter (HOSPITAL_COMMUNITY)
Admission: RE | Admit: 2016-03-12 | Discharge: 2016-03-12 | Disposition: A | Discharge status: Home / Self Care | Payer: Medicare Other | Source: Ambulatory Visit | Attending: Pulmonary Disease | Admitting: Pulmonary Disease

## 2016-03-12 VITALS — Wt 166.2 lb

## 2016-03-12 DIAGNOSIS — R0602 Shortness of breath: Secondary | ICD-10-CM | POA: Diagnosis not present

## 2016-03-12 NOTE — Progress Notes (Signed)
Daily Session Note  Patient Details  Name: Krystal Hopkins MRN: [id number] Date of Birth: 1931-12-23 Referring Provider:        Pulmonary Rehab Walk Test from 01/23/2016 in [place]   Referring Provider  Dr. Einar Gip      Encounter Date: 03/12/2016  Check In:     Session Check In - 03/12/16 1343    Check-In   Location [facility]   Staff Present Rosebud Poles, RN, BSN;Molly diVincenzo, MS, ACSM RCEP, Exercise Physiologist;Portia Rollene Rotunda, RN, BSN   Supervising physician immediately available to respond to emergencies [facility] immediately available   Physician(s) Dr. Marily Memos   Medication changes reported     No   Fall or balance concerns reported    No   Warm-up and Cool-down Performed as group-led instruction   Resistance Training Performed Yes   VAD Patient? No   Pain Assessment   Currently in Pain? No/denies   Multiple Pain Sites No      Capillary Blood Glucose: No results found for this or any previous visit (from the past 24 hour(s)).      Exercise Prescription Changes - 03/12/16 1500    Response to Exercise   Blood Pressure (Admit) 102/54 mmHg   Blood Pressure (Exercise) 98/70 mmHg   Blood Pressure (Exit) 98/60 mmHg   Heart Rate (Admit) 65 bpm   Heart Rate (Exercise) 98 bpm   Heart Rate (Exit) 76 bpm   Oxygen Saturation (Admit) 92 %   Oxygen Saturation (Exercise) 97 %   Oxygen Saturation (Exit) 98 %   Rating of Perceived Exertion (Exercise) 15   Perceived Dyspnea (Exercise) 3   Duration Progress to 45 minutes of aerobic exercise without signs/symptoms of physical distress   Intensity THRR unchanged   Progression   Progression Continue to progress workloads to maintain intensity without signs/symptoms of physical distress.   Resistance Training   Training Prescription Yes   Weight orange bands   Reps 10-12   Interval Training   Interval Training No   Bike   Level 4   Minutes 15   Track   Laps 11   Minutes 15     Goals Met:  Exercise tolerated well Strength training completed today  Goals Unmet:  Not Applicable  Comments: Service time is from 1330 to 1500    Dr. Rush Farmer is Medical Director for Pulmonary Rehab at [facility].

## 2016-03-17 ENCOUNTER — Encounter (HOSPITAL_COMMUNITY)
Admission: RE | Admit: 2016-03-17 | Discharge: 2016-03-17 | Disposition: A | Discharge status: Home / Self Care | Payer: Medicare Other | Source: Ambulatory Visit | Attending: Pulmonary Disease | Admitting: Pulmonary Disease

## 2016-03-17 DIAGNOSIS — R0602 Shortness of breath: Secondary | ICD-10-CM | POA: Diagnosis not present

## 2016-03-17 NOTE — Progress Notes (Signed)
Pulmonary Individual Treatment Plan  Patient Details  Name: Krystal Hopkins MRN: [id number] Date of Birth: June 10, 1932 Referring Provider:        Pulmonary Rehab Walk Test from 01/23/2016 in [place]   Referring Provider  Dr. Einar Gip      Initial Encounter Date:       Pulmonary Rehab Walk Test from 01/23/2016 in [place]   Date  01/27/16   Referring Provider  Dr. Einar Gip      Visit Diagnosis: No diagnosis found.  Patient's Home Medications on Admission:   Current outpatient prescriptions:  .  aspirin EC 81 MG tablet, Take 81 mg by mouth every morning., Disp: , Rfl:  .  BENICAR HCT 20-12.5 MG per tablet, Take 1 tablet by mouth daily. Reported on 01/20/2016, Disp: , Rfl:  .  Calcium Carbonate (CALCIUM 600 PO), Take 1 tablet by mouth daily., Disp: , Rfl:  .  cetirizine (ZYRTEC) 10 MG tablet, Take 10 mg by mouth daily., Disp: , Rfl:  .  levothyroxine (SYNTHROID, LEVOTHROID) 100 MCG tablet, Take 100 mcg by mouth every morning., Disp: , Rfl:  .  Multiple Vitamin (MULITIVITAMIN WITH MINERALS) TABS, Take 1 tablet by mouth every morning., Disp: , Rfl:  .  Omega-3 Fatty Acids (FISH OIL PO), Take 1 tablet by mouth daily., Disp: , Rfl:  .  pravastatin (PRAVACHOL) 20 MG tablet, Take 20 mg by mouth daily., Disp: , Rfl:  .  valsartan-hydrochlorothiazide (DIOVAN-HCT) 80-12.5 MG tablet, Take 1 tablet by mouth daily., Disp: , Rfl:  .  vitamin C (ASCORBIC ACID) 500 MG tablet, Take 500 mg by mouth daily. Reported on 01/20/2016, Disp: , Rfl:  .  VITAMIN D, CHOLECALCIFEROL, PO, Take 1 tablet by mouth daily., Disp: , Rfl:  .  vitamin E 100 UNIT capsule, Take 100 Units by mouth daily. Reported on 01/20/2016, Disp: , Rfl:   Past Medical History: Past Medical History  Diagnosis Date  . Hypertension   . Blood transfusion without reported diagnosis   . Cataract   . Hyperlipidemia   . Osteoporosis   . Hypothyroidism   . Colon polyp 2014     TUBULAR ADENOMA.    Tobacco Use: History  Smoking status  . Former Smoker -- 1.00 packs/day for 20 years  . Types: Cigarettes  . Quit date: 10/24/1970  Smokeless tobacco  . Never Used    Labs:     Recent Review Flowsheet Data    There is no flowsheet data to display.      Capillary Blood Glucose: No results found for: GLUCAP   ADL UCSD:     Pulmonary Assessment Scores      01/30/16 1605       ADL UCSD   ADL Phase Entry     SOB Score total 31        Pulmonary Function Assessment:     Pulmonary Function Assessment - 01/20/16 1247    Breath   Bilateral Breath Sounds Clear   Shortness of Breath Yes;Fear of Shortness of Breath;Limiting activity;Panic with Shortness of Breath      Exercise Target Goals:    Exercise Program Goal: Individual exercise prescription set with THRR, safety & activity barriers. Participant demonstrates ability to understand and report RPE using BORG scale, to self-measure pulse accurately, and to acknowledge the importance of the exercise prescription.  Exercise Prescription Goal: Starting with aerobic activity 30 plus minutes a day, 3 days per week for initial exercise  prescription. Provide home exercise prescription and guidelines that participant acknowledges understanding prior to discharge.  Activity Barriers & Risk Stratification:     Activity Barriers & Cardiac Risk Stratification - 01/20/16 1246    Activity Barriers & Cardiac Risk Stratification   Activity Barriers Joint Problems;Deconditioning;Muscular Weakness;Shortness of Breath      6 Minute Walk:     6 Minute Walk      01/27/16 0928       6 Minute Walk   Phase Initial     Distance 1200 feet     Walk Time 6 minutes     # of Rest Breaks 0     MPH 2.27     METS 1.89     RPE 12     Perceived Dyspnea  1     VO2 Peak 6.6     Symptoms No     Resting HR 80 bpm     Resting BP 108/80 mmHg     Max Ex. HR 101 bpm     Max Ex. BP 140/72 mmHg     Interval HR    Baseline HR 80     2 Minute HR 99     4 Minute HR 100     6 Minute HR 99     Interval Heart Rate? Yes     Interval Oxygen   Baseline Oxygen Saturation % 99 %     Baseline Liters of Oxygen 0 L     1 Minute Oxygen Saturation % 99 %     1 Minute Liters of Oxygen 0 L     2 Minute Oxygen Saturation % 99 %     2 Minute Liters of Oxygen 0 L     3 Minute Oxygen Saturation % 99 %     3 Minute Liters of Oxygen 0 L     4 Minute Oxygen Saturation % 99 %     4 Minute Liters of Oxygen 0 L     5 Minute Oxygen Saturation % 98 %     5 Minute Liters of Oxygen 0 L     6 Minute Oxygen Saturation % 99 %     6 Minute Liters of Oxygen 0 L     2 Minute Post Oxygen Saturation % 98 %     2 Minute Post Liters of Oxygen 0 L        Initial Exercise Prescription:     Initial Exercise Prescription - 01/27/16 0900    Date of Initial Exercise RX and Referring Provider   Date 01/27/16   Referring Provider Dr. Einar Gip   Oxygen   Oxygen --  room air   Bike   Level 2   Minutes 15   NuStep   Level 2   Minutes 15   METs 1.5   Track   Laps 11   Minutes 15   Prescription Details   Frequency (times per week) 2   Duration Progress to 45 minutes of aerobic exercise without signs/symptoms of physical distress   Intensity   THRR 40-80% of Max Heartrate 56-111   Ratings of Perceived Exertion 11-13   Perceived Dyspnea 0-4   Progression   Progression Continue progressive overload as per policy without signs/symptoms or physical distress.   Resistance Training   Training Prescription Yes   Weight orange band   Reps 10-12      Perform Capillary Blood Glucose checks as needed.  Exercise Prescription Changes:  Exercise Prescription Changes      01/30/16 1500 02/06/16 1600 02/11/16 1500 02/13/16 1645 02/20/16 1600   Exercise Review   Progression Yes Yes Yes     Response to Exercise   Blood Pressure (Admit) 110/60 mmHg 110/60 mmHg 128/70 mmHg 108/60 mmHg 118/78 mmHg   Blood Pressure (Exercise)  116/60 mmHg 116/60 mmHg 114/60 mmHg 100/60 mmHg 100/60 mmHg   Blood Pressure (Exit) 112/60 mmHg 112/60 mmHg 100/50 mmHg 106/84 mmHg 94/56 mmHg   Heart Rate (Admit) 76 bpm 76 bpm 62 bpm 66 bpm 72 bpm   Heart Rate (Exercise) 95 bpm 95 bpm 111 bpm 91 bpm 92 bpm   Heart Rate (Exit) 81 bpm 81 bpm 77 bpm 65 bpm 78 bpm   Oxygen Saturation (Admit) 99 % 99 % 96 % 98 % 99 %   Oxygen Saturation (Exercise) 96 % 96 % 97 % 99 % 98 %   Oxygen Saturation (Exit) 97 % 97 % 95 % 100 % 97 %   Rating of Perceived Exertion (Exercise) 11 11 13 13 11    Perceived Dyspnea (Exercise) 1 1 1 1 1    Comments  reviewed home exercise prescription with patient      Duration   Progress to 45 minutes of aerobic exercise without signs/symptoms of physical distress Progress to 45 minutes of aerobic exercise without signs/symptoms of physical distress Progress to 45 minutes of aerobic exercise without signs/symptoms of physical distress   Intensity   THRR unchanged THRR unchanged THRR unchanged   Progression   Progression Continue to progress workloads to maintain intensity without signs/symptoms of physical distress. Continue to progress workloads to maintain intensity without signs/symptoms of physical distress. Continue to progress workloads to maintain intensity without signs/symptoms of physical distress. Continue to progress workloads to maintain intensity without signs/symptoms of physical distress. Continue to progress workloads to maintain intensity without signs/symptoms of physical distress.   Resistance Training   Training Prescription Yes Yes Yes Yes Yes   Weight orange band orange band orange bands' orange bands' orange bands'   Reps 10-12 10-12 10-12 10-12 10-12   Interval Training   Interval Training No No No No No   Bike   Level 3 3 3 3     Minutes 15 15 15 15     NuStep   Level 2 2 4 4 4    Minutes 15 15 15 15 15    METs 2.4 2.4 2.8 2.9 2.7   Track   Laps 12 12 13  14    Minutes 15 15 15  15      02/25/16 1551  02/27/16 1500 03/03/16 1500 03/05/16 1600 03/10/16 1500   Exercise Review   Progression Yes    Yes   Response to Exercise   Blood Pressure (Admit) 130/64 mmHg 122/68 mmHg 122/64 mmHg 108/60 mmHg 110/60 mmHg   Blood Pressure (Exercise) 120/60 mmHg 130/60 mmHg 132/62 mmHg 130/60 mmHg 130/68 mmHg   Blood Pressure (Exit) 110/62 mmHg 114/64 mmHg 98/62 mmHg 110/60 mmHg 134/72 mmHg   Heart Rate (Admit) 67 bpm 61 bpm 66 bpm 71 bpm 64 bpm   Heart Rate (Exercise) 88 bpm 108 bpm 97 bpm 102 bpm 99 bpm   Heart Rate (Exit) 67 bpm 73 bpm 71 bpm 63 bpm 71 bpm   Oxygen Saturation (Admit) 98 % 99 % 96 % 99 % 98 %   Oxygen Saturation (Exercise) 97 % 97 % 100 % 95 % 98 %   Oxygen Saturation (Exit) 97 % 100 % 97 % 96 %  97 %   Rating of Perceived Exertion (Exercise) 13 15 13 11 13    Perceived Dyspnea (Exercise) 3 3 2 1 2    Duration Progress to 45 minutes of aerobic exercise without signs/symptoms of physical distress Progress to 45 minutes of aerobic exercise without signs/symptoms of physical distress Progress to 45 minutes of aerobic exercise without signs/symptoms of physical distress Progress to 45 minutes of aerobic exercise without signs/symptoms of physical distress Progress to 45 minutes of aerobic exercise without signs/symptoms of physical distress   Intensity THRR unchanged THRR unchanged THRR unchanged THRR unchanged THRR unchanged   Progression   Progression Continue to progress workloads to maintain intensity without signs/symptoms of physical distress. Continue to progress workloads to maintain intensity without signs/symptoms of physical distress. Continue to progress workloads to maintain intensity without signs/symptoms of physical distress. Continue to progress workloads to maintain intensity without signs/symptoms of physical distress. Continue to progress workloads to maintain intensity without signs/symptoms of physical distress.   Resistance Training   Training Prescription Yes Yes Yes Yes Yes    Weight orange bands' orange bands orange bands orange bands orange bands   Reps 10-12 10-12 10-12 10-12 10-12   Interval Training   Interval Training No No No No No   Bike   Level 3 3 3  4    Minutes 15 15 15  15    NuStep   Level 5  5 5 6    Minutes 15  15 15 15    METs 2.1  3.2 3.3 3.1   Track   Laps 14 13 13 13 15    Minutes 15 15 15 15 15      03/12/16 1500 03/17/16 1559         Response to Exercise   Blood Pressure (Admit) 102/54 mmHg 92/66 mmHg      Blood Pressure (Exercise) 98/70 mmHg 104/60 mmHg      Blood Pressure (Exit) 98/60 mmHg 104/64 mmHg      Heart Rate (Admit) 65 bpm 72 bpm      Heart Rate (Exercise) 98 bpm 99 bpm      Heart Rate (Exit) 76 bpm 76 bpm      Oxygen Saturation (Admit) 92 % 97 %      Oxygen Saturation (Exercise) 97 % 96 %      Oxygen Saturation (Exit) 98 % 94 %      Rating of Perceived Exertion (Exercise) 15 15      Perceived Dyspnea (Exercise) 3 2      Duration Progress to 45 minutes of aerobic exercise without signs/symptoms of physical distress Progress to 45 minutes of aerobic exercise without signs/symptoms of physical distress      Intensity THRR unchanged THRR unchanged      Progression   Progression Continue to progress workloads to maintain intensity without signs/symptoms of physical distress. Continue to progress workloads to maintain intensity without signs/symptoms of physical distress.      Resistance Training   Training Prescription Yes Yes      Weight orange bands orange bands      Reps 10-12 10-12      Interval Training   Interval Training No No      Bike   Level 4 4      Minutes 15 15      NuStep   Level  6      Minutes  15      METs  2.2      Track   Laps 11 15  Minutes 15 15         Exercise Comments:      Exercise Comments      02/06/16 1623 02/20/16 0944 03/19/16 0840       Exercise Comments reviewed home exercise prescription with patient Patient has only had 4 exercise sessions but is progressing. Will  continue to monitor and progress as appropriate. Patient is increasing well in her exercise intensity. Has increased lap walking from 12 to 15 laps, nustep level 2 to 6. Will cont. to monitor and progress intensity here at rehab and at home.         Discharge Exercise Prescription (Final Exercise Prescription Changes):     Exercise Prescription Changes - 03/17/16 1559    Response to Exercise   Blood Pressure (Admit) 92/66 mmHg   Blood Pressure (Exercise) 104/60 mmHg   Blood Pressure (Exit) 104/64 mmHg   Heart Rate (Admit) 72 bpm   Heart Rate (Exercise) 99 bpm   Heart Rate (Exit) 76 bpm   Oxygen Saturation (Admit) 97 %   Oxygen Saturation (Exercise) 96 %   Oxygen Saturation (Exit) 94 %   Rating of Perceived Exertion (Exercise) 15   Perceived Dyspnea (Exercise) 2   Duration Progress to 45 minutes of aerobic exercise without signs/symptoms of physical distress   Intensity THRR unchanged   Progression   Progression Continue to progress workloads to maintain intensity without signs/symptoms of physical distress.   Resistance Training   Training Prescription Yes   Weight orange bands   Reps 10-12   Interval Training   Interval Training No   Bike   Level 4   Minutes 15   NuStep   Level 6   Minutes 15   METs 2.2   Track   Laps 15   Minutes 15       Nutrition:  Target Goals: Understanding of nutrition guidelines, daily intake of sodium 1500mg , cholesterol 200mg , calories 30% from fat and 7% or less from saturated fats, daily to have 5 or more servings of fruits and vegetables.  Biometrics:     Pre Biometrics - 01/20/16 1255    Pre Biometrics   Grip Strength 18 kg       Nutrition Therapy Plan and Nutrition Goals:   Nutrition Discharge: Rate Your Plate Scores:     Nutrition Assessments - 02/20/16 1523    Rate Your Plate Scores   Pre Score 55      Psychosocial: Target Goals: Acknowledge presence or absence of depression, maximize coping skills, provide  positive support system. Participant is able to verbalize types and ability to use techniques and skills needed for reducing stress and depression.  Initial Review & Psychosocial Screening:     Initial Psych Review & Screening - 01/20/16 1308    Family Dynamics   Good Support System? Yes   Comments --  daughter and grandson are VERY supportive   Barriers   Psychosocial barriers to participate in program There are no identifiable barriers or psychosocial needs.   Screening Interventions   Interventions Encouraged to exercise      Quality of Life Scores:     Quality of Life - 01/30/16 1604    Quality of Life Scores   Health/Function Pre 26.14 %   Socioeconomic Pre 30 %   Psych/Spiritual Pre 25.14 %   Family Pre 25.38 %   GLOBAL Pre 26.67 %      PHQ-9:     Recent Review Flowsheet Data    Depression screen  [facility] 2/9 01/20/2016 05/03/2013   Decreased Interest 0 0   Down, Depressed, Hopeless 0 1   PHQ - 2 Score 0 1      Psychosocial Evaluation and Intervention:     Psychosocial Evaluation - 01/20/16 1309    Psychosocial Evaluation & Interventions   Interventions Encouraged to exercise with the program and follow exercise prescription   Comments Good psychological outlook.   Continued Psychosocial Services Needed No      Psychosocial Re-Evaluation:     Psychosocial Re-Evaluation      02/18/16 1612 03/10/16 1130         Psychosocial Re-Evaluation   Interventions Encouraged to attend Pulmonary Rehabilitation for the exercise  none needed Encouraged to attend Pulmonary Rehabilitation for the exercise      Comments good support system, no intervention needed Continues to have no psychosocial concerns.      Continued Psychosocial Services Needed No No        Education: Education Goals: Education classes will be provided on a weekly basis, covering required topics. Participant will state understanding/return demonstration of topics presented.  Learning  Barriers/Preferences:     Learning Barriers/Preferences - 01/20/16 1246    Learning Barriers/Preferences   Learning Barriers None   Learning Preferences Audio;Group Instruction;Computer/Internet;Pictoral;Skilled Demonstration;Verbal Instruction;Video;Written Material;Individual Instruction      Education Topics: Risk Factor Reduction:  -Group instruction that is supported by a PowerPoint presentation. Instructor discusses the definition of a risk factor, different risk factors for pulmonary disease, and how the heart and lungs work together.     Nutrition for Pulmonary Patient:  -Group instruction provided by PowerPoint slides, verbal discussion, and written materials to support subject matter. The instructor gives an explanation and review of healthy diet recommendations, which includes a discussion on weight management, recommendations for fruit and vegetable consumption, as well as protein, fluid, caffeine, fiber, sodium, sugar, and alcohol. Tips for eating when patients are short of breath are discussed.      PULMONARY REHAB OTHER RESPIRATORY from 03/12/2016 in [place]   Date  03/05/16   Educator  RD   Instruction Review Code  2- meets goals/outcomes      Pursed Lip Breathing:  -Group instruction that is supported by demonstration and informational handouts. Instructor discusses the benefits of pursed lip and diaphragmatic breathing and detailed demonstration on how to preform both.        PULMONARY REHAB OTHER RESPIRATORY from 02/27/2016 in [place]   Date  02/27/16   Educator  EP   Instruction Review Code  2- meets goals/outcomes      Oxygen Safety:  -Group instruction provided by PowerPoint, verbal discussion, and written material to support subject matter. There is an overview of "What is Oxygen" and "Why do we need it".  Instructor also reviews how to create a safe environment for oxygen use, the importance  of using oxygen as prescribed, and the risks of noncompliance. There is a brief discussion on traveling with oxygen and resources the patient may utilize.   Oxygen Equipment:  -Group instruction provided by [facility] Staff utilizing handouts, written materials, and equipment demonstrations.   Signs and Symptoms:  -Group instruction provided by written material and verbal discussion to support subject matter. Warning signs and symptoms of infection, stroke, and heart attack are reviewed and when to call the physician/911 reinforced. Tips for preventing the spread of infection discussed.   Advanced Directives:  -Group instruction provided by verbal instruction  and written material to support subject matter. Instructor reviews Advanced Directive laws and proper instruction for filling out document.      PULMONARY REHAB OTHER RESPIRATORY from 03/12/2016 in [place]   Date  02/20/16   Educator  Jeanella Craze   Instruction Review Code  2- meets goals/outcomes      Pulmonary Video:  -Group video education that reviews the importance of medication and oxygen compliance, exercise, good nutrition, pulmonary hygiene, and pursed lip and diaphragmatic breathing for the pulmonary patient.          PULMONARY REHAB OTHER RESPIRATORY from 03/12/2016 in [place]   Date  03/12/16   Instruction Review Code  2- meets goals/outcomes      Exercise for the Pulmonary Patient:  -Group instruction that is supported by a PowerPoint presentation. Instructor discusses benefits of exercise, core components of exercise, frequency, duration, and intensity of an exercise routine, importance of utilizing pulse oximetry during exercise, safety while exercising, and options of places to exercise outside of rehab.     Pulmonary Medications:  -Verbally interactive group education provided by instructor with focus on inhaled medications and proper  administration.   Anatomy and Physiology of the Respiratory System and Intimacy:  -Group instruction provided by PowerPoint, verbal discussion, and written material to support subject matter. Instructor reviews respiratory cycle and anatomical components of the respiratory system and their functions. Instructor also reviews differences in obstructive and restrictive respiratory diseases with examples of each. Intimacy, Sex, and Sexuality differences are reviewed with a discussion on how relationships can change when diagnosed with pulmonary disease. Common sexual concerns are reviewed.   Knowledge Questionnaire Score:     Knowledge Questionnaire Score - 01/30/16 1604    Knowledge Questionnaire Score   Pre Score 9/13      Core Components/Risk Factors/Patient Goals at Admission:     Personal Goals and Risk Factors at Admission - 01/20/16 1257    Core Components/Risk Factors/Patient Goals on Admission   Increase Strength and Stamina Yes   Intervention Provide advice, education, support and counseling about physical activity/exercise needs.;Develop an individualized exercise prescription for aerobic and resistive training based on initial evaluation findings, risk stratification, comorbidities and participant's personal goals.   Expected Outcomes Achievement of increased cardiorespiratory fitness and enhanced flexibility, muscular endurance and strength shown through measurements of functional capacity and personal statement of participant.   Improve shortness of breath with ADL's Yes   Intervention Provide education, individualized exercise plan and daily activity instruction to help decrease symptoms of SOB with activities of daily living.   Expected Outcomes Short Term: Achieves a reduction of symptoms when performing activities of daily living.   Develop more efficient breathing techniques such as purse lipped breathing and diaphragmatic breathing; and practicing self-pacing with activity  Yes   Intervention Provide education, demonstration and support about specific breathing techniuqes utilized for more efficient breathing. Include techniques such as pursed lipped breathing, diaphragmatic breathing and self-pacing activity.   Expected Outcomes Short Term: Participant will be able to demonstrate and use breathing techniques as needed throughout daily activities.      Core Components/Risk Factors/Patient Goals Review:      Goals and Risk Factor Review      01/20/16 1304 02/18/16 1609 03/10/16 1125       Core Components/Risk Factors/Patient Goals Review   Personal Goals Review Increase Strength and Stamina;Improve shortness of breath with ADL's       Review increase workloads  as tolerated by patient Has only attended 4 exercise sessions.  She has not noticed an increase in strength and stamina as of yet. She is attending exercise classes regularly and is feeling stronger.  She is mid way through the program.     Expected Outcomes increased strength, stamina and less shortness of breath with exercise and ADL's. increased strength and stamina with more time in the program To continue to imcrease her strength and stamina as we increase her workloads        Core Components/Risk Factors/Patient Goals at Discharge (Final Review):      Goals and Risk Factor Review - 03/10/16 1125    Core Components/Risk Factors/Patient Goals Review   Review She is attending exercise classes regularly and is feeling stronger.  She is mid way through the program.   Expected Outcomes To continue to imcrease her strength and stamina as we increase her workloads      ITP Comments:   Comments: ITP REVIEW Pt is making expected progress toward personal goals after completing 12 sessions.   Recommend continued exercise, life style modification, education, and utilization of breathing techniques to increase stamina and strength and decrease shortness of breath with exertion.

## 2016-03-17 NOTE — Progress Notes (Signed)
Daily Session Note  Patient Details  Name: Krystal Hopkins MRN: [id number] Date of Birth: May 12, 1932 Referring Provider:        Pulmonary Rehab Walk Test from 01/23/2016 in [place]   Referring Provider  Dr. Einar Gip      Encounter Date: 03/17/2016  Check In:     Session Check In - 03/17/16 1333    Check-In   Location [facility]   Staff Present Rosebud Poles, RN, BSN;Molly diVincenzo, MS, ACSM RCEP, Exercise Physiologist;Lisa Ysidro Evert, Felipe Drone, RN, MHA;Ewa Hipp Rollene Rotunda, RN, BSN   Supervising physician immediately available to respond to emergencies [facility] immediately available   Physician(s) Dr. Marily Memos   Medication changes reported     No   Fall or balance concerns reported    No   Warm-up and Cool-down Performed as group-led instruction   Resistance Training Performed Yes   VAD Patient? No   Pain Assessment   Currently in Pain? No/denies   Multiple Pain Sites No      Capillary Blood Glucose: No results found for this or any previous visit (from the past 24 hour(s)).      Exercise Prescription Changes - 03/17/16 1559    Response to Exercise   Blood Pressure (Admit) 92/66 mmHg   Blood Pressure (Exercise) 104/60 mmHg   Blood Pressure (Exit) 104/64 mmHg   Heart Rate (Admit) 72 bpm   Heart Rate (Exercise) 99 bpm   Heart Rate (Exit) 76 bpm   Oxygen Saturation (Admit) 97 %   Oxygen Saturation (Exercise) 96 %   Oxygen Saturation (Exit) 94 %   Rating of Perceived Exertion (Exercise) 15   Perceived Dyspnea (Exercise) 2   Duration Progress to 45 minutes of aerobic exercise without signs/symptoms of physical distress   Intensity THRR unchanged   Progression   Progression Continue to progress workloads to maintain intensity without signs/symptoms of physical distress.   Resistance Training   Training Prescription Yes   Weight orange bands   Reps 10-12   Interval Training   Interval Training No   Bike    Level 4   Minutes 15   NuStep   Level 6   Minutes 15   METs 2.2   Track   Laps 15   Minutes 15     Goals Met:  Independence with exercise equipment Improved SOB with ADL's Using PLB without cueing & demonstrates good technique Exercise tolerated well No report of cardiac concerns or symptoms Strength training completed today  Goals Unmet:  Not Applicable  Comments: Service time is from 1330 to 1500   Dr. Rush Farmer is Medical Director for Pulmonary Rehab at [facility].

## 2016-03-19 ENCOUNTER — Encounter (HOSPITAL_COMMUNITY)
Admission: RE | Admit: 2016-03-19 | Discharge: 2016-03-19 | Disposition: A | Discharge status: Home / Self Care | Payer: Medicare Other | Source: Ambulatory Visit | Attending: Pulmonary Disease | Admitting: Pulmonary Disease

## 2016-03-24 ENCOUNTER — Encounter (HOSPITAL_COMMUNITY)
Admission: RE | Admit: 2016-03-24 | Discharge: 2016-03-24 | Disposition: A | Discharge status: Home / Self Care | Payer: Medicare Other | Source: Ambulatory Visit | Attending: Pulmonary Disease | Admitting: Pulmonary Disease

## 2016-03-24 VITALS — Wt 167.8 lb

## 2016-03-24 DIAGNOSIS — R0602 Shortness of breath: Secondary | ICD-10-CM

## 2016-03-24 NOTE — Progress Notes (Signed)
Daily Session Note  Patient Details  Name: Krystal Hopkins MRN: [id number] Date of Birth: 06-Nov-1931 Referring Provider:        Pulmonary Rehab Walk Test from 01/23/2016 in [place]   Referring Provider  Dr. Einar Gip      Encounter Date: 03/24/2016  Check In:     Session Check In - 03/24/16 1351    Check-In   Location [facility]   Staff Present Rosebud Poles, RN, BSN;Molly diVincenzo, MS, ACSM RCEP, Exercise Physiologist;Layanna Charo Ysidro Evert, RN   Supervising physician immediately available to respond to emergencies [facility] immediately available   Physician(s) Dr. Marily Memos   Medication changes reported     No   Fall or balance concerns reported    No   Warm-up and Cool-down Performed as group-led instruction   Resistance Training Performed Yes   VAD Patient? No   Pain Assessment   Currently in Pain? No/denies   Multiple Pain Sites No      Capillary Blood Glucose: No results found for this or any previous visit (from the past 24 hour(s)).      Exercise Prescription Changes - 03/24/16 1600    Response to Exercise   Blood Pressure (Admit) 120/64 mmHg   Blood Pressure (Exercise) 108/64 mmHg   Blood Pressure (Exit) 90/60 mmHg   Heart Rate (Admit) 77 bpm   Heart Rate (Exercise) 108 bpm   Heart Rate (Exit) 82 bpm   Oxygen Saturation (Admit) 97 %   Oxygen Saturation (Exercise) 96 %   Oxygen Saturation (Exit) 96 %   Rating of Perceived Exertion (Exercise) 14   Perceived Dyspnea (Exercise) 2   Duration Progress to 45 minutes of aerobic exercise without signs/symptoms of physical distress   Intensity THRR unchanged   Progression   Progression Continue to progress workloads to maintain intensity without signs/symptoms of physical distress.   Resistance Training   Training Prescription Yes   Weight orange bands   Reps 10-12   Interval Training   Interval Training No   Bike   Level 4   Minutes 15   NuStep   Level 6   Minutes 15   METs 3.2   Track   Laps 11   Minutes 15     Goals Met:  Exercise tolerated well No report of cardiac concerns or symptoms Strength training completed today  Goals Unmet:  Not Applicable  Comments: .Service time is from 1330 to 74    Dr. Rush Farmer is Medical Director for Pulmonary Rehab at [facility].

## 2016-03-26 ENCOUNTER — Encounter (HOSPITAL_COMMUNITY)
Admission: RE | Admit: 2016-03-26 | Discharge: 2016-03-26 | Disposition: A | Discharge status: Home / Self Care | Payer: Medicare Other | Source: Ambulatory Visit | Attending: Pulmonary Disease | Admitting: Pulmonary Disease

## 2016-03-26 VITALS — Wt 170.0 lb

## 2016-03-26 DIAGNOSIS — R0602 Shortness of breath: Secondary | ICD-10-CM

## 2016-03-26 NOTE — Progress Notes (Signed)
Daily Session Note  Patient Details  Name: Krystal Hopkins MRN: [id number] Date of Birth: [birth year] Referring Provider:        Pulmonary Rehab Walk Test from 01/23/2016 in [place]   Referring Provider  Dr. Einar Gip      Encounter Date: 03/26/2016  Check In:     Session Check In - 03/26/16 1520    Check-In   Location [facility]   Staff Present Rosebud Poles, RN, BSN;Molly diVincenzo, MS, ACSM RCEP, Exercise Physiologist   Physician(s) Dr. Marily Memos   Medication changes reported     No   Fall or balance concerns reported    No   Warm-up and Cool-down Performed as group-led instruction   Resistance Training Performed Yes   VAD Patient? No   Pain Assessment   Currently in Pain? No/denies   Multiple Pain Sites No      Capillary Blood Glucose: No results found for this or any previous visit (from the past 24 hour(s)).      Exercise Prescription Changes - 03/26/16 1500    Response to Exercise   Blood Pressure (Admit) 110/60 mmHg   Blood Pressure (Exercise) 124/62 mmHg   Blood Pressure (Exit) 104/60 mmHg   Heart Rate (Admit) 65 bpm   Heart Rate (Exercise) 105 bpm   Heart Rate (Exit) 70 bpm   Oxygen Saturation (Admit) 100 %   Oxygen Saturation (Exercise) 97 %   Oxygen Saturation (Exit) 97 %   Rating of Perceived Exertion (Exercise) 15   Perceived Dyspnea (Exercise) 2   Duration Progress to 45 minutes of aerobic exercise without signs/symptoms of physical distress   Intensity THRR unchanged   Progression   Progression Continue to progress workloads to maintain intensity without signs/symptoms of physical distress.   Resistance Training   Training Prescription Yes   Weight orange bands   Reps 10-12   Interval Training   Interval Training No   Bike   Level 4   Minutes 15   NuStep   Level 6   Minutes 15   METs 3.2   Track   Laps 13   Minutes 15     Goals Met:  Exercise tolerated well No report of cardiac  concerns or symptoms Strength training completed today  Goals Unmet:  Not Applicable  Comments: Service time is from 1330 to 1500    Dr. Rush Farmer is Medical Director for Pulmonary Rehab at [facility].

## 2016-03-31 ENCOUNTER — Encounter (HOSPITAL_COMMUNITY)
Admission: RE | Admit: 2016-03-31 | Discharge: 2016-03-31 | Disposition: A | Discharge status: Home / Self Care | Payer: Medicare Other | Source: Ambulatory Visit | Attending: Pulmonary Disease | Admitting: Pulmonary Disease

## 2016-03-31 VITALS — Wt 168.4 lb

## 2016-03-31 DIAGNOSIS — R0602 Shortness of breath: Secondary | ICD-10-CM

## 2016-03-31 NOTE — Progress Notes (Signed)
Daily Session Note  Patient Details  Name: Krystal Hopkins MRN: [id number] Date of Birth: November 23, 1931 Referring Provider:        Pulmonary Rehab Walk Test from 01/23/2016 in [place]   Referring Provider  Dr. Einar Gip      Encounter Date: 03/31/2016  Check In:     Session Check In - 03/31/16 1348    Check-In   Location [facility]   Staff Present Rosebud Poles, RN, BSN;Amrie Gurganus Ysidro Evert, RN;Portia Rollene Rotunda, RN, BSN;Molly diVincenzo, MS, ACSM RCEP, Exercise Physiologist   Supervising physician immediately available to respond to emergencies [facility] immediately available   Physician(s) Dr. Marily Memos   Medication changes reported     No   Fall or balance concerns reported    No   Warm-up and Cool-down Performed as group-led instruction   Resistance Training Performed Yes   VAD Patient? No   Pain Assessment   Currently in Pain? No/denies   Multiple Pain Sites No      Capillary Blood Glucose: No results found for this or any previous visit (from the past 24 hour(s)).      Exercise Prescription Changes - 03/31/16 1500    Response to Exercise   Blood Pressure (Admit) 112/60 mmHg   Blood Pressure (Exercise) 100/46 mmHg   Blood Pressure (Exit) 98/56 mmHg   Heart Rate (Admit) 67 bpm   Heart Rate (Exercise) 106 bpm   Heart Rate (Exit) 75 bpm   Oxygen Saturation (Admit) 100 %   Oxygen Saturation (Exercise) 96 %   Oxygen Saturation (Exit) 95 %   Rating of Perceived Exertion (Exercise) 15   Perceived Dyspnea (Exercise) 2   Duration Progress to 45 minutes of aerobic exercise without signs/symptoms of physical distress   Intensity THRR unchanged   Progression   Progression Continue to progress workloads to maintain intensity without signs/symptoms of physical distress.   Resistance Training   Training Prescription Yes   Weight ornage bands   Reps 10-12   Interval Training   Interval Training No   Bike   Level 4   Minutes 17   NuStep   Level 6   Minutes 17   METs 3.2   Track   Laps 14   Minutes 17     Goals Met:  Exercise tolerated well No report of cardiac concerns or symptoms Strength training completed today  Goals Unmet:  Not Applicable  Comments: Service time is from 1330 to 1505    Dr. Rush Farmer is Medical Director for Pulmonary Rehab at [facility].

## 2016-04-02 ENCOUNTER — Encounter (HOSPITAL_COMMUNITY): Payer: Medicare Other

## 2016-04-07 ENCOUNTER — Encounter (HOSPITAL_COMMUNITY): Payer: Medicare Other

## 2016-04-09 ENCOUNTER — Encounter (HOSPITAL_COMMUNITY)
Admission: RE | Admit: 2016-04-09 | Discharge: 2016-04-09 | Disposition: A | Discharge status: Home / Self Care | Payer: Medicare Other | Source: Ambulatory Visit | Attending: Pulmonary Disease | Admitting: Pulmonary Disease

## 2016-04-09 VITALS — Wt 168.9 lb

## 2016-04-09 DIAGNOSIS — R0602 Shortness of breath: Secondary | ICD-10-CM | POA: Diagnosis not present

## 2016-04-09 NOTE — Progress Notes (Signed)
Daily Session Note  Patient Details  Name: Krystal Hopkins MRN: [id number] Date of Birth: [birth year] Referring Provider:        Pulmonary Rehab Walk Test from 01/23/2016 in [place]   Referring Provider  Dr. Einar Gip      Encounter Date: 04/09/2016  Check In:     Session Check In - 04/09/16 1354    Check-In   Location [facility]   Staff Present Rosebud Poles, RN, BSN;Lisa Ysidro Evert, Felipe Drone, RN, MHA;Molly diVincenzo, MS, ACSM RCEP, Exercise Physiologist   Supervising physician immediately available to respond to emergencies [facility] immediately available   Physician(s) Dr. Cruzita Lederer   Medication changes reported     No   Fall or balance concerns reported    No   Warm-up and Cool-down Performed as group-led instruction   Resistance Training Performed Yes   VAD Patient? No   Pain Assessment   Currently in Pain? No/denies   Multiple Pain Sites No      Capillary Blood Glucose: No results found for this or any previous visit (from the past 24 hour(s)).      Exercise Prescription Changes - 04/09/16 1600    Response to Exercise   Blood Pressure (Admit) 122/62 mmHg   Blood Pressure (Exercise) 138/70 mmHg   Blood Pressure (Exit) 106/60 mmHg   Heart Rate (Admit) 64 bpm   Heart Rate (Exercise) 109 bpm   Heart Rate (Exit) 77 bpm   Oxygen Saturation (Admit) 99 %   Oxygen Saturation (Exercise) 96 %   Oxygen Saturation (Exit) 97 %   Rating of Perceived Exertion (Exercise) 14   Perceived Dyspnea (Exercise) 3   Duration Progress to 45 minutes of aerobic exercise without signs/symptoms of physical distress   Intensity THRR unchanged   Progression   Progression Continue to progress workloads to maintain intensity without signs/symptoms of physical distress.   Resistance Training   Training Prescription Yes   Weight ornage bands   Reps 10-12   Interval Training   Interval Training No   NuStep   Level 6   Minutes 17   METs 2.3   Track   Laps 14   Minutes 17     Goals Met:  Improved SOB with ADL's Using PLB without cueing & demonstrates good technique Exercise tolerated well Strength training completed today  Goals Unmet:  Not Applicable  Comments: Service time is from 1330 to 1530    Dr. Rush Farmer is Medical Director for Pulmonary Rehab at [facility].

## 2016-04-14 ENCOUNTER — Encounter (HOSPITAL_COMMUNITY)
Admission: RE | Admit: 2016-04-14 | Discharge: 2016-04-14 | Disposition: A | Discharge status: Home / Self Care | Payer: Medicare Other | Source: Ambulatory Visit | Attending: Pulmonary Disease | Admitting: Pulmonary Disease

## 2016-04-16 ENCOUNTER — Encounter (HOSPITAL_COMMUNITY)
Admission: RE | Admit: 2016-04-16 | Discharge: 2016-04-16 | Disposition: A | Discharge status: Home / Self Care | Payer: Medicare Other | Source: Ambulatory Visit | Attending: Pulmonary Disease | Admitting: Pulmonary Disease

## 2016-04-16 NOTE — Progress Notes (Signed)
Pulmonary Individual Treatment Plan  Patient Details  Name: Krystal Hopkins MRN: [id number] Date of Birth: [birth year] Referring Provider:        Pulmonary Rehab Walk Test from 01/23/2016 in [place]   Referring Provider  Dr. Einar Gip      Initial Encounter Date:       Pulmonary Rehab Walk Test from 01/23/2016 in [facility]   Date  01/27/16   Referring Provider  Dr. Einar Gip      Visit Diagnosis: No diagnosis found.  Patient's Home Medications on Admission:   Current outpatient prescriptions:  .  aspirin EC 81 MG tablet, Take 81 mg by mouth every morning., Disp: , Rfl:  .  BENICAR HCT 20-12.5 MG per tablet, Take 1 tablet by mouth daily. Reported on 01/20/2016, Disp: , Rfl:  .  Calcium Carbonate (CALCIUM 600 PO), Take 1 tablet by mouth daily., Disp: , Rfl:  .  cetirizine (ZYRTEC) 10 MG tablet, Take 10 mg by mouth daily., Disp: , Rfl:  .  levothyroxine (SYNTHROID, LEVOTHROID) 100 MCG tablet, Take 100 mcg by mouth every morning., Disp: , Rfl:  .  Multiple Vitamin (MULITIVITAMIN WITH MINERALS) TABS, Take 1 tablet by mouth every morning., Disp: , Rfl:  .  Omega-3 Fatty Acids (FISH OIL PO), Take 1 tablet by mouth daily., Disp: , Rfl:  .  pravastatin (PRAVACHOL) 20 MG tablet, Take 20 mg by mouth daily., Disp: , Rfl:  .  valsartan-hydrochlorothiazide (DIOVAN-HCT) 80-12.5 MG tablet, Take 1 tablet by mouth daily., Disp: , Rfl:  .  vitamin C (ASCORBIC ACID) 500 MG tablet, Take 500 mg by mouth daily. Reported on 01/20/2016, Disp: , Rfl:  .  VITAMIN D, CHOLECALCIFEROL, PO, Take 1 tablet by mouth daily., Disp: , Rfl:  .  vitamin E 100 UNIT capsule, Take 100 Units by mouth daily. Reported on 01/20/2016, Disp: , Rfl:   Past Medical History: Past Medical History  Diagnosis Date  . Hypertension   . Blood transfusion without reported diagnosis   . Cataract   . Hyperlipidemia   . Osteoporosis   . Hypothyroidism   . Colon polyp 2014     TUBULAR ADENOMA.    Tobacco Use: History  Smoking status  . Former Smoker -- 1.00 packs/day for 20 years  . Types: Cigarettes  . Quit date: 10/24/1970  Smokeless tobacco  . Never Used    Labs: Recent Review Flowsheet Data    There is no flowsheet data to display.      Capillary Blood Glucose: No results found for: GLUCAP   ADL UCSD:     Pulmonary Assessment Scores      01/30/16 1605       ADL UCSD   ADL Phase Entry     SOB Score total 31        Pulmonary Function Assessment:     Pulmonary Function Assessment - 01/20/16 1247    Breath   Bilateral Breath Sounds Clear   Shortness of Breath Yes;Fear of Shortness of Breath;Limiting activity;Panic with Shortness of Breath      Exercise Target Goals:    Exercise Program Goal: Individual exercise prescription set with THRR, safety & activity barriers. Participant demonstrates ability to understand and report RPE using BORG scale, to self-measure pulse accurately, and to acknowledge the importance of the exercise prescription.  Exercise Prescription Goal: Starting with aerobic activity 30 plus minutes a day, 3 days per week for initial exercise prescription. Provide home exercise  prescription and guidelines that participant acknowledges understanding prior to discharge.  Activity Barriers & Risk Stratification:     Activity Barriers & Cardiac Risk Stratification - 01/20/16 1246    Activity Barriers & Cardiac Risk Stratification   Activity Barriers Joint Problems;Deconditioning;Muscular Weakness;Shortness of Breath      6 Minute Walk:     6 Minute Walk      01/27/16 0928       6 Minute Walk   Phase Initial     Distance 1200 feet     Walk Time 6 minutes     # of Rest Breaks 0     MPH 2.27     METS 1.89     RPE 12     Perceived Dyspnea  1     VO2 Peak 6.6     Symptoms No     Resting HR 80 bpm     Resting BP 108/80 mmHg     Max Ex. HR 101 bpm     Max Ex. BP 140/72 mmHg     Interval HR    Baseline HR 80     2 Minute HR 99     4 Minute HR 100     6 Minute HR 99     Interval Heart Rate? Yes     Interval Oxygen   Baseline Oxygen Saturation % 99 %     Baseline Liters of Oxygen 0 L     1 Minute Oxygen Saturation % 99 %     1 Minute Liters of Oxygen 0 L     2 Minute Oxygen Saturation % 99 %     2 Minute Liters of Oxygen 0 L     3 Minute Oxygen Saturation % 99 %     3 Minute Liters of Oxygen 0 L     4 Minute Oxygen Saturation % 99 %     4 Minute Liters of Oxygen 0 L     5 Minute Oxygen Saturation % 98 %     5 Minute Liters of Oxygen 0 L     6 Minute Oxygen Saturation % 99 %     6 Minute Liters of Oxygen 0 L     2 Minute Post Oxygen Saturation % 98 %     2 Minute Post Liters of Oxygen 0 L        Initial Exercise Prescription:     Initial Exercise Prescription - 01/27/16 0900    Date of Initial Exercise RX and Referring Provider   Date 01/27/16   Referring Provider Dr. Einar Gip   Oxygen   Oxygen --  room air   Bike   Level 2   Minutes 15   NuStep   Level 2   Minutes 15   METs 1.5   Track   Laps 11   Minutes 15   Prescription Details   Frequency (times per week) 2   Duration Progress to 45 minutes of aerobic exercise without signs/symptoms of physical distress   Intensity   THRR 40-80% of Max Heartrate 56-111   Ratings of Perceived Exertion 11-13   Perceived Dyspnea 0-4   Progression   Progression Continue progressive overload as per policy without signs/symptoms or physical distress.   Resistance Training   Training Prescription Yes   Weight orange band   Reps 10-12      Perform Capillary Blood Glucose checks as needed.  Exercise Prescription Changes:     Exercise Prescription Changes  01/30/16 1500 02/06/16 1600 02/11/16 1500 02/13/16 1645 02/20/16 1600   Exercise Review   Progression Yes Yes Yes     Response to Exercise   Blood Pressure (Admit) 110/60 mmHg 110/60 mmHg 128/70 mmHg 108/60 mmHg 118/78 mmHg   Blood Pressure (Exercise)  116/60 mmHg 116/60 mmHg 114/60 mmHg 100/60 mmHg 100/60 mmHg   Blood Pressure (Exit) 112/60 mmHg 112/60 mmHg 100/50 mmHg 106/84 mmHg 94/56 mmHg   Heart Rate (Admit) 76 bpm 76 bpm 62 bpm 66 bpm 72 bpm   Heart Rate (Exercise) 95 bpm 95 bpm 111 bpm 91 bpm 92 bpm   Heart Rate (Exit) 81 bpm 81 bpm 77 bpm 65 bpm 78 bpm   Oxygen Saturation (Admit) 99 % 99 % 96 % 98 % 99 %   Oxygen Saturation (Exercise) 96 % 96 % 97 % 99 % 98 %   Oxygen Saturation (Exit) 97 % 97 % 95 % 100 % 97 %   Rating of Perceived Exertion (Exercise) 11 11 13 13 11    Perceived Dyspnea (Exercise) 1 1 1 1 1    Comments  reviewed home exercise prescription with patient      Duration   Progress to 45 minutes of aerobic exercise without signs/symptoms of physical distress Progress to 45 minutes of aerobic exercise without signs/symptoms of physical distress Progress to 45 minutes of aerobic exercise without signs/symptoms of physical distress   Intensity   THRR unchanged THRR unchanged THRR unchanged   Progression   Progression Continue to progress workloads to maintain intensity without signs/symptoms of physical distress. Continue to progress workloads to maintain intensity without signs/symptoms of physical distress. Continue to progress workloads to maintain intensity without signs/symptoms of physical distress. Continue to progress workloads to maintain intensity without signs/symptoms of physical distress. Continue to progress workloads to maintain intensity without signs/symptoms of physical distress.   Resistance Training   Training Prescription Yes Yes Yes Yes Yes   Weight orange band orange band orange bands' orange bands' orange bands'   Reps 10-12 10-12 10-12 10-12 10-12   Interval Training   Interval Training No No No No No   Bike   Level 3 3 3 3     Minutes 15 15 15 15     NuStep   Level 2 2 4 4 4    Minutes 15 15 15 15 15    METs 2.4 2.4 2.8 2.9 2.7   Track   Laps 12 12 13  14    Minutes 15 15 15  15      02/25/16 1551  02/27/16 1500 03/03/16 1500 03/05/16 1600 03/10/16 1500   Exercise Review   Progression Yes    Yes   Response to Exercise   Blood Pressure (Admit) 130/64 mmHg 122/68 mmHg 122/64 mmHg 108/60 mmHg 110/60 mmHg   Blood Pressure (Exercise) 120/60 mmHg 130/60 mmHg 132/62 mmHg 130/60 mmHg 130/68 mmHg   Blood Pressure (Exit) 110/62 mmHg 114/64 mmHg 98/62 mmHg 110/60 mmHg 134/72 mmHg   Heart Rate (Admit) 67 bpm 61 bpm 66 bpm 71 bpm 64 bpm   Heart Rate (Exercise) 88 bpm 108 bpm 97 bpm 102 bpm 99 bpm   Heart Rate (Exit) 67 bpm 73 bpm 71 bpm 63 bpm 71 bpm   Oxygen Saturation (Admit) 98 % 99 % 96 % 99 % 98 %   Oxygen Saturation (Exercise) 97 % 97 % 100 % 95 % 98 %   Oxygen Saturation (Exit) 97 % 100 % 97 % 96 % 97 %   Rating of Perceived  Exertion (Exercise) 13 15 13 11 13    Perceived Dyspnea (Exercise) 3 3 2 1 2    Duration Progress to 45 minutes of aerobic exercise without signs/symptoms of physical distress Progress to 45 minutes of aerobic exercise without signs/symptoms of physical distress Progress to 45 minutes of aerobic exercise without signs/symptoms of physical distress Progress to 45 minutes of aerobic exercise without signs/symptoms of physical distress Progress to 45 minutes of aerobic exercise without signs/symptoms of physical distress   Intensity THRR unchanged THRR unchanged THRR unchanged THRR unchanged THRR unchanged   Progression   Progression Continue to progress workloads to maintain intensity without signs/symptoms of physical distress. Continue to progress workloads to maintain intensity without signs/symptoms of physical distress. Continue to progress workloads to maintain intensity without signs/symptoms of physical distress. Continue to progress workloads to maintain intensity without signs/symptoms of physical distress. Continue to progress workloads to maintain intensity without signs/symptoms of physical distress.   Resistance Training   Training Prescription Yes Yes Yes Yes Yes    Weight orange bands' orange bands orange bands orange bands orange bands   Reps 10-12 10-12 10-12 10-12 10-12   Interval Training   Interval Training No No No No No   Bike   Level 3 3 3  4    Minutes 15 15 15  15    NuStep   Level 5  5 5 6    Minutes 15  15 15 15    METs 2.1  3.2 3.3 3.1   Track   Laps 14 13 13 13 15    Minutes 15 15 15 15 15      03/12/16 1500 03/17/16 1559 03/24/16 1600 03/26/16 1500 03/31/16 1500   Response to Exercise   Blood Pressure (Admit) 102/54 mmHg 92/66 mmHg 120/64 mmHg 110/60 mmHg 112/60 mmHg   Blood Pressure (Exercise) 98/70 mmHg 104/60 mmHg 108/64 mmHg 124/62 mmHg 100/46 mmHg   Blood Pressure (Exit) 98/60 mmHg 104/64 mmHg 90/60 mmHg 104/60 mmHg 98/56 mmHg   Heart Rate (Admit) 65 bpm 72 bpm 77 bpm 65 bpm 67 bpm   Heart Rate (Exercise) 98 bpm 99 bpm 108 bpm 105 bpm 106 bpm   Heart Rate (Exit) 76 bpm 76 bpm 82 bpm 70 bpm 75 bpm   Oxygen Saturation (Admit) 92 % 97 % 97 % 100 % 100 %   Oxygen Saturation (Exercise) 97 % 96 % 96 % 97 % 96 %   Oxygen Saturation (Exit) 98 % 94 % 96 % 97 % 95 %   Rating of Perceived Exertion (Exercise) 15 15 14 15 15    Perceived Dyspnea (Exercise) 3 2 2 2 2    Duration Progress to 45 minutes of aerobic exercise without signs/symptoms of physical distress Progress to 45 minutes of aerobic exercise without signs/symptoms of physical distress Progress to 45 minutes of aerobic exercise without signs/symptoms of physical distress Progress to 45 minutes of aerobic exercise without signs/symptoms of physical distress Progress to 45 minutes of aerobic exercise without signs/symptoms of physical distress   Intensity THRR unchanged THRR unchanged THRR unchanged THRR unchanged THRR unchanged   Progression   Progression Continue to progress workloads to maintain intensity without signs/symptoms of physical distress. Continue to progress workloads to maintain intensity without signs/symptoms of physical distress. Continue to progress workloads to  maintain intensity without signs/symptoms of physical distress. Continue to progress workloads to maintain intensity without signs/symptoms of physical distress. Continue to progress workloads to maintain intensity without signs/symptoms of physical distress.   Resistance Training   Training Prescription Yes  Yes Yes Yes Yes   Weight orange bands orange bands orange bands orange bands ornage bands   Reps 10-12 10-12 10-12 10-12 10-12   Interval Training   Interval Training No No No No No   Bike   Level 4 4 4 4 4    Minutes 15 15 15 15 17    NuStep   Level  6 6 6 6    Minutes  15 15 15 17    METs  2.2 3.2 3.2 3.2   Track   Laps 11 15 11 13 14    Minutes 15 15 15 15 17      04/09/16 1600           Response to Exercise   Blood Pressure (Admit) 122/62 mmHg       Blood Pressure (Exercise) 138/70 mmHg       Blood Pressure (Exit) 106/60 mmHg       Heart Rate (Admit) 64 bpm       Heart Rate (Exercise) 109 bpm       Heart Rate (Exit) 77 bpm       Oxygen Saturation (Admit) 99 %       Oxygen Saturation (Exercise) 96 %       Oxygen Saturation (Exit) 97 %       Rating of Perceived Exertion (Exercise) 14       Perceived Dyspnea (Exercise) 3       Duration Progress to 45 minutes of aerobic exercise without signs/symptoms of physical distress       Intensity THRR unchanged       Progression   Progression Continue to progress workloads to maintain intensity without signs/symptoms of physical distress.       Resistance Training   Training Prescription Yes       Weight ornage bands       Reps 10-12       Interval Training   Interval Training No       NuStep   Level 6       Minutes 17       METs 2.3       Track   Laps 14       Minutes 17          Exercise Comments:     Exercise Comments      02/06/16 1623 02/20/16 0944 03/19/16 0840 04/16/16 0845     Exercise Comments reviewed home exercise prescription with patient Patient has only had 4 exercise sessions but is progressing. Will  continue to monitor and progress as appropriate. Patient is increasing well in her exercise intensity. Has increased lap walking from 12 to 15 laps, nustep level 2 to 6. Will cont. to monitor and progress intensity here at rehab and at home.  Continues to tolerate exercise well, continue to increase workloads as tolerated.       Discharge Exercise Prescription (Final Exercise Prescription Changes):     Exercise Prescription Changes - 04/09/16 1600    Response to Exercise   Blood Pressure (Admit) 122/62 mmHg   Blood Pressure (Exercise) 138/70 mmHg   Blood Pressure (Exit) 106/60 mmHg   Heart Rate (Admit) 64 bpm   Heart Rate (Exercise) 109 bpm   Heart Rate (Exit) 77 bpm   Oxygen Saturation (Admit) 99 %   Oxygen Saturation (Exercise) 96 %   Oxygen Saturation (Exit) 97 %   Rating of Perceived Exertion (Exercise) 14   Perceived Dyspnea (Exercise) 3   Duration Progress to 45 minutes of  aerobic exercise without signs/symptoms of physical distress   Intensity THRR unchanged   Progression   Progression Continue to progress workloads to maintain intensity without signs/symptoms of physical distress.   Resistance Training   Training Prescription Yes   Weight ornage bands   Reps 10-12   Interval Training   Interval Training No   NuStep   Level 6   Minutes 17   METs 2.3   Track   Laps 14   Minutes 17       Nutrition:  Target Goals: Understanding of nutrition guidelines, daily intake of sodium 1500mg , cholesterol 200mg , calories 30% from fat and 7% or less from saturated fats, daily to have 5 or more servings of fruits and vegetables.  Biometrics:     Pre Biometrics - 01/20/16 1255    Pre Biometrics   Grip Strength 18 kg       Nutrition Therapy Plan and Nutrition Goals:   Nutrition Discharge: Rate Your Plate Scores:     Nutrition Assessments - 02/20/16 1523    Rate Your Plate Scores   Pre Score 55      Psychosocial: Target Goals: Acknowledge presence or absence  of depression, maximize coping skills, provide positive support system. Participant is able to verbalize types and ability to use techniques and skills needed for reducing stress and depression.  Initial Review & Psychosocial Screening:     Initial Psych Review & Screening - 01/20/16 1308    Family Dynamics   Good Support System? Yes   Comments --  daughter and grandson are VERY supportive   Barriers   Psychosocial barriers to participate in program There are no identifiable barriers or psychosocial needs.   Screening Interventions   Interventions Encouraged to exercise      Quality of Life Scores:     Quality of Life - 01/30/16 1604    Quality of Life Scores   Health/Function Pre 26.14 %   Socioeconomic Pre 30 %   Psych/Spiritual Pre 25.14 %   Family Pre 25.38 %   GLOBAL Pre 26.67 %      PHQ-9:     Recent Review Flowsheet Data    Depression screen [facility] 2/9 01/20/2016 05/03/2013   Decreased Interest 0 0   Down, Depressed, Hopeless 0 1   PHQ - 2 Score 0 1      Psychosocial Evaluation and Intervention:     Psychosocial Evaluation - 01/20/16 1309    Psychosocial Evaluation & Interventions   Interventions Encouraged to exercise with the program and follow exercise prescription   Comments Good psychological outlook.   Continued Psychosocial Services Needed No      Psychosocial Re-Evaluation:     Psychosocial Re-Evaluation      02/18/16 1612 03/10/16 1130 04/13/16 1153       Psychosocial Re-Evaluation   Interventions Encouraged to attend Pulmonary Rehabilitation for the exercise  none needed Encouraged to attend Pulmonary Rehabilitation for the exercise Encouraged to attend Pulmonary Rehabilitation for the exercise     Comments good support system, no intervention needed Continues to have no psychosocial concerns. No psychosocial concerns identified at this time.     Continued Psychosocial Services Needed No No No       Education: Education Goals: Education  classes will be provided on a weekly basis, covering required topics. Participant will state understanding/return demonstration of topics presented.  Learning Barriers/Preferences:     Learning Barriers/Preferences - 01/20/16 1246    Learning Barriers/Preferences   Learning Barriers None  Learning Preferences Audio;Group Instruction;Computer/Internet;Pictoral;Skilled Demonstration;Verbal Instruction;Video;Written Material;Individual Instruction      Education Topics: Risk Factor Reduction:  -Group instruction that is supported by a PowerPoint presentation. Instructor discusses the definition of a risk factor, different risk factors for pulmonary disease, and how the heart and lungs work together.     Nutrition for Pulmonary Patient:  -Group instruction provided by PowerPoint slides, verbal discussion, and written materials to support subject matter. The instructor gives an explanation and review of healthy diet recommendations, which includes a discussion on weight management, recommendations for fruit and vegetable consumption, as well as protein, fluid, caffeine, fiber, sodium, sugar, and alcohol. Tips for eating when patients are short of breath are discussed.          PULMONARY REHAB OTHER RESPIRATORY from 04/09/2016 in [place]   Date  03/05/16   Educator  RD   Instruction Review Code  2- meets goals/outcomes      Pursed Lip Breathing:  -Group instruction that is supported by demonstration and informational handouts. Instructor discusses the benefits of pursed lip and diaphragmatic breathing and detailed demonstration on how to preform both.        PULMONARY REHAB OTHER RESPIRATORY from 02/27/2016 in [place]   Date  02/27/16   Educator  EP   Instruction Review Code  2- meets goals/outcomes      Oxygen Safety:  -Group instruction provided by PowerPoint, verbal discussion, and written material to support  subject matter. There is an overview of "What is Oxygen" and "Why do we need it".  Instructor also reviews how to create a safe environment for oxygen use, the importance of using oxygen as prescribed, and the risks of noncompliance. There is a brief discussion on traveling with oxygen and resources the patient may utilize.   Oxygen Equipment:  -Group instruction provided by [facility] Staff utilizing handouts, written materials, and equipment demonstrations.   Signs and Symptoms:  -Group instruction provided by written material and verbal discussion to support subject matter. Warning signs and symptoms of infection, stroke, and heart attack are reviewed and when to call the physician/911 reinforced. Tips for preventing the spread of infection discussed.   Advanced Directives:  -Group instruction provided by verbal instruction and written material to support subject matter. Instructor reviews Advanced Directive laws and proper instruction for filling out document.      PULMONARY REHAB OTHER RESPIRATORY from 04/09/2016 in [place]   Date  02/20/16   Educator  Jeanella Craze   Instruction Review Code  2- meets goals/outcomes      Pulmonary Video:  -Group video education that reviews the importance of medication and oxygen compliance, exercise, good nutrition, pulmonary hygiene, and pursed lip and diaphragmatic breathing for the pulmonary patient.      PULMONARY REHAB OTHER RESPIRATORY from 04/09/2016 in [place]   Date  03/12/16   Instruction Review Code  2- meets goals/outcomes      Exercise for the Pulmonary Patient:  -Group instruction that is supported by a PowerPoint presentation. Instructor discusses benefits of exercise, core components of exercise, frequency, duration, and intensity of an exercise routine, importance of utilizing pulse oximetry during exercise, safety while exercising, and options of places to exercise  outside of rehab.        PULMONARY REHAB OTHER RESPIRATORY from 04/09/2016 in [place]   Date  04/09/16   Educator  EP   Instruction Review Code  2- meets goals/outcomes      Pulmonary Medications:  -Verbally interactive group education provided by instructor with focus on inhaled medications and proper administration.   Anatomy and Physiology of the Respiratory System and Intimacy:  -Group instruction provided by PowerPoint, verbal discussion, and written material to support subject matter. Instructor reviews respiratory cycle and anatomical components of the respiratory system and their functions. Instructor also reviews differences in obstructive and restrictive respiratory diseases with examples of each. Intimacy, Sex, and Sexuality differences are reviewed with a discussion on how relationships can change when diagnosed with pulmonary disease. Common sexual concerns are reviewed.   Knowledge Questionnaire Score:     Knowledge Questionnaire Score - 01/30/16 1604    Knowledge Questionnaire Score   Pre Score 9/13      Core Components/Risk Factors/Patient Goals at Admission:     Personal Goals and Risk Factors at Admission - 01/20/16 1257    Core Components/Risk Factors/Patient Goals on Admission   Increase Strength and Stamina Yes   Intervention Provide advice, education, support and counseling about physical activity/exercise needs.;Develop an individualized exercise prescription for aerobic and resistive training based on initial evaluation findings, risk stratification, comorbidities and participant's personal goals.   Expected Outcomes Achievement of increased cardiorespiratory fitness and enhanced flexibility, muscular endurance and strength shown through measurements of functional capacity and personal statement of participant.   Improve shortness of breath with ADL's Yes   Intervention Provide education, individualized exercise plan and daily  activity instruction to help decrease symptoms of SOB with activities of daily living.   Expected Outcomes Short Term: Achieves a reduction of symptoms when performing activities of daily living.   Develop more efficient breathing techniques such as purse lipped breathing and diaphragmatic breathing; and practicing self-pacing with activity Yes   Intervention Provide education, demonstration and support about specific breathing techniuqes utilized for more efficient breathing. Include techniques such as pursed lipped breathing, diaphragmatic breathing and self-pacing activity.   Expected Outcomes Short Term: Participant will be able to demonstrate and use breathing techniques as needed throughout daily activities.      Core Components/Risk Factors/Patient Goals Review:      Goals and Risk Factor Review      01/20/16 1304 02/18/16 1609 03/10/16 1125 04/13/16 1149     Core Components/Risk Factors/Patient Goals Review   Personal Goals Review Increase Strength and Stamina;Improve shortness of breath with ADL's   Increase Strength and Stamina;Improve shortness of breath with ADL's    Review increase workloads as tolerated by patient Has only attended 4 exercise sessions.  She has not noticed an increase in strength and stamina as of yet. She is attending exercise classes regularly and is feeling stronger.  She is mid way through the program. Feeling much stronger, but at 82 y/o she has not seen the progress she had when she went through tthe program 3 years ago.  Tolerates exercise better on flat surfaces and in heated/ air conditioned environment.    Expected Outcomes increased strength, stamina and less shortness of breath with exercise and ADL's. increased strength and stamina with more time in the program To continue to imcrease her strength and stamina as we increase her workloads She should graduate in the next 30 days, continue to increase workloads as tolerated.       Core Components/Risk  Factors/Patient Goals at Discharge (Final Review):      Goals and Risk Factor Review - 04/13/16 1149    Core Components/Risk  Factors/Patient Goals Review   Personal Goals Review Increase Strength and Stamina;Improve shortness of breath with ADL's   Review Feeling much stronger, but at 82 y/o she has not seen the progress she had when she went through tthe program 3 years ago.  Tolerates exercise better on flat surfaces and in heated/ air conditioned environment.   Expected Outcomes She should graduate in the next 30 days, continue to increase workloads as tolerated.      ITP Comments:   Comments: ITP REVIEW Pt is making expected progress toward personal goals after completing 16 sessions.   Recommend continued exercise, life style modification, education, and utilization of breathing techniques to increase stamina and strength and decrease shortness of breath with exertion.

## 2016-04-21 ENCOUNTER — Encounter (HOSPITAL_COMMUNITY)
Admission: RE | Admit: 2016-04-21 | Discharge: 2016-04-21 | Disposition: A | Discharge status: Home / Self Care | Payer: Medicare Other | Source: Ambulatory Visit | Attending: Pulmonary Disease | Admitting: Pulmonary Disease

## 2016-04-21 VITALS — Wt 168.0 lb

## 2016-04-21 DIAGNOSIS — R0602 Shortness of breath: Secondary | ICD-10-CM

## 2016-04-21 NOTE — Progress Notes (Signed)
Daily Session Note  Patient Details  Name: Krystal Hopkins MRN: [id number] Date of Birth: 1931/12/03 Referring Provider:        Pulmonary Rehab Walk Test from 01/23/2016 in [place]   Referring Provider  Dr. Einar Gip      Encounter Date: 04/21/2016  Check In:     Session Check In - 04/21/16 1549    Check-In   Location [facility]   Staff Present Rosebud Poles, RN, BSN;Molly diVincenzo, MS, ACSM RCEP, Exercise Physiologist;Portia Rollene Rotunda, Therapist, sports, BSN;Ramon Dredge, RN, MHA;Jeniyah Menor Ysidro Evert, RN   Supervising physician immediately available to respond to emergencies [facility] immediately available   Physician(s) Dr. Marily Memos   Medication changes reported     No   Fall or balance concerns reported    No   Warm-up and Cool-down Performed as group-led instruction   Resistance Training Performed Yes   VAD Patient? No   Pain Assessment   Currently in Pain? No/denies   Multiple Pain Sites No      Capillary Blood Glucose: No results found for this or any previous visit (from the past 24 hour(s)).      Exercise Prescription Changes - 04/21/16 1500    Response to Exercise   Blood Pressure (Admit) 122/66 mmHg   Blood Pressure (Exercise) 120/70 mmHg   Blood Pressure (Exit) 94/64 mmHg   Heart Rate (Admit) 78 bpm   Heart Rate (Exercise) 120 bpm   Heart Rate (Exit) 78 bpm   Oxygen Saturation (Admit) 95 %   Oxygen Saturation (Exercise) 96 %   Oxygen Saturation (Exit) 94 %   Rating of Perceived Exertion (Exercise) 14   Perceived Dyspnea (Exercise) 3   Duration Progress to 45 minutes of aerobic exercise without signs/symptoms of physical distress   Intensity THRR unchanged   Progression   Progression Continue to progress workloads to maintain intensity without signs/symptoms of physical distress.   Resistance Training   Training Prescription Yes   Weight orange bands   Reps 10-12   Interval Training   Interval Training No   Bike    Level 4   Minutes 17   NuStep   Level 6   Minutes 17   METs 3   Track   Laps 15   Minutes 17     Goals Met:  Exercise tolerated well No report of cardiac concerns or symptoms Strength training completed today  Goals Unmet:  Not Applicable  Comments:  Service time is from 1330 to 1500    Dr. Rush Farmer is Medical Director for Pulmonary Rehab at [facility].

## 2016-04-23 ENCOUNTER — Encounter (HOSPITAL_COMMUNITY)
Admission: RE | Admit: 2016-04-23 | Discharge: 2016-04-23 | Disposition: A | Discharge status: Home / Self Care | Payer: Medicare Other | Source: Ambulatory Visit | Attending: Pulmonary Disease | Admitting: Pulmonary Disease

## 2016-04-23 VITALS — Wt 168.0 lb

## 2016-04-23 DIAGNOSIS — R0602 Shortness of breath: Secondary | ICD-10-CM

## 2016-04-23 NOTE — Progress Notes (Signed)
Daily Session Note  Patient Details  Name: Krystal Hopkins MRN: [id number] Date of Birth: 11-16-1931 Referring Provider:        Pulmonary Rehab Walk Test from 01/23/2016 in [place]   Referring Provider  Dr. Einar Gip      Encounter Date: 04/23/2016  Check In:     Session Check In - 04/23/16 1330    Check-In   [facility]   Staff Present Rosebud Poles, RN, BSN;Lisa Ysidro Evert, RN;Ziva Nunziata Rollene Rotunda, RN, BSN;Ramon Dredge, RN, MHA;Molly diVincenzo, MS, ACSM RCEP, Exercise Physiologist   Supervising physician immediately available to respond to emergencies [facility] immediately available   Physician(s) Dr. Cruzita Lederer   Medication changes reported     No   Fall or balance concerns reported    No   Warm-up and Cool-down Performed as group-led instruction   Resistance Training Performed Yes   VAD Patient? No   Pain Assessment   Currently in Pain? No/denies   Multiple Pain Sites No      Capillary Blood Glucose: No results found for this or any previous visit (from the past 24 hour(s)).      Exercise Prescription Changes - 04/23/16 1631    Response to Exercise   Blood Pressure (Admit) 126/70 mmHg   Blood Pressure (Exercise) 130/68 mmHg   Blood Pressure (Exit) 116/60 mmHg   Heart Rate (Admit) 70 bpm   Heart Rate (Exercise) 106 bpm   Heart Rate (Exit) 76 bpm   Oxygen Saturation (Admit) 100 %   Oxygen Saturation (Exercise) 96 %   Oxygen Saturation (Exit) 93 %   Rating of Perceived Exertion (Exercise) 13   Perceived Dyspnea (Exercise) 3   Duration Progress to 45 minutes of aerobic exercise without signs/symptoms of physical distress   Intensity THRR unchanged   Progression   Progression Continue to progress workloads to maintain intensity without signs/symptoms of physical distress.   Resistance Training   Training Prescription Yes   Weight orange bands   Reps 10-12  10 min   Interval Training   Interval Training  No   Bike   Level 4   Minutes 17   NuStep   Level 6   Minutes 17   METs 3     Goals Met:  Improved SOB with ADL's Using PLB without cueing & demonstrates good technique Exercise tolerated well No report of cardiac concerns or symptoms Strength training completed today  Goals Unmet:  Not Applicable  Comments: Service time is from 1330 to 1540   Dr. Rush Farmer is Medical Director for Pulmonary Rehab at [facility].

## 2016-04-28 ENCOUNTER — Encounter (HOSPITAL_COMMUNITY)
Admission: RE | Admit: 2016-04-28 | Discharge: 2016-04-28 | Disposition: A | Discharge status: Home / Self Care | Payer: Medicare Other | Source: Ambulatory Visit | Attending: Pulmonary Disease | Admitting: Pulmonary Disease

## 2016-04-28 VITALS — Wt 167.1 lb

## 2016-04-28 DIAGNOSIS — R0602 Shortness of breath: Secondary | ICD-10-CM

## 2016-04-28 NOTE — Progress Notes (Signed)
Daily Session Note  Patient Details  Name: Krystal Hopkins MRN: [id number] Date of Birth: [birth year] Referring Provider:   April Manson Pulmonary Rehab Walk Test from 01/23/2016 in [place]  Referring Provider  Dr. Einar Gip      Encounter Date: 04/28/2016  Check In:     Session Check In - 04/28/16 1535      Check-In   [facility]   Staff Present Rosebud Poles, RN, BSN;Molly diVincenzo, MS, ACSM RCEP, Exercise Physiologist;Lisa Ysidro Evert, RN;Portia Rollene Rotunda, RN, BSN   Supervising physician immediately available to respond to emergencies [facility] immediately available   Physician(s) Dr. Marily Memos   Medication changes reported     No   Fall or balance concerns reported    No   Warm-up and Cool-down Performed as group-led instruction   Resistance Training Performed Yes   VAD Patient? No     Pain Assessment   Currently in Pain? No/denies   Multiple Pain Sites No      Capillary Blood Glucose: No results found for this or any previous visit (from the past 24 hour(s)).      Exercise Prescription Changes - 04/28/16 1500      Response to Exercise   Blood Pressure (Admit) 100/52   Blood Pressure (Exercise) 100/50   Blood Pressure (Exit) 110/64   Heart Rate (Admit) 80 bpm   Heart Rate (Exercise) 112 bpm   Heart Rate (Exit) 84 bpm   Oxygen Saturation (Admit) 99 %   Oxygen Saturation (Exercise) 97 %   Oxygen Saturation (Exit) 97 %   Rating of Perceived Exertion (Exercise) 13   Perceived Dyspnea (Exercise) 3   Duration Progress to 45 minutes of aerobic exercise without signs/symptoms of physical distress   Intensity THRR unchanged     Progression   Progression Continue to progress workloads to maintain intensity without signs/symptoms of physical distress.     Resistance Training   Training Prescription Yes   Weight orange bands   Reps 10-12  10 minutes of strength training     Interval Training   Interval  Training No     Bike   Level 4   Minutes 17     NuStep   Level 6   Minutes 17   METs 3     Track   Laps 13   Minutes 17     Goals Met:  Independence with exercise equipment Improved SOB with ADL's Exercise tolerated well Strength training completed today  Goals Unmet:  Not Applicable  Comments: Service time is from 1330 to 1510    Dr. Rush Farmer is Medical Director for Pulmonary Rehab at [facility].

## 2016-04-30 ENCOUNTER — Encounter (HOSPITAL_COMMUNITY)
Admission: RE | Admit: 2016-04-30 | Discharge: 2016-04-30 | Disposition: A | Discharge status: Home / Self Care | Payer: Medicare Other | Source: Ambulatory Visit | Attending: Pulmonary Disease | Admitting: Pulmonary Disease

## 2016-04-30 VITALS — Wt 169.1 lb

## 2016-04-30 DIAGNOSIS — R0602 Shortness of breath: Secondary | ICD-10-CM

## 2016-04-30 NOTE — Progress Notes (Signed)
Daily Session Note  Patient Details  Name: Krystal Hopkins MRN: [id number] Date of Birth: 05-04-1932 Referring Provider:   April Manson Pulmonary Rehab Walk Test from 01/23/2016 in [place]  Referring Provider  Dr. Einar Gip      Encounter Date: 04/30/2016  Check In:     Session Check In - 04/30/16 1340      Check-In   [facility]   Staff Present Rosebud Poles, RN, BSN;Molly diVincenzo, MS, ACSM RCEP, Exercise Physiologist;Annedrea Rosezella [place], RN, MHA;Caterin Tabares Rollene Rotunda, RN, BSN   Supervising physician immediately available to respond to emergencies [facility] immediately available   Physician(s) Dr. Denny Levy   Medication changes reported     No   Fall or balance concerns reported    No   Warm-up and Cool-down Performed as group-led instruction   Resistance Training Performed Yes   VAD Patient? No     Pain Assessment   Currently in Pain? No/denies   Multiple Pain Sites No      Capillary Blood Glucose: No results found for this or any previous visit (from the past 24 hour(s)).      Exercise Prescription Changes - 04/30/16 1547      Response to Exercise   Blood Pressure (Admit) 98/60   Blood Pressure (Exercise) 108/62   Blood Pressure (Exit) 106/58   Heart Rate (Admit) 63 bpm   Heart Rate (Exercise) 105 bpm   Heart Rate (Exit) 60 bpm   Oxygen Saturation (Admit) 100 %   Oxygen Saturation (Exercise) 97 %   Oxygen Saturation (Exit) 97 %   Rating of Perceived Exertion (Exercise) 13   Perceived Dyspnea (Exercise) 3   Duration Progress to 45 minutes of aerobic exercise without signs/symptoms of physical distress   Intensity THRR unchanged     Progression   Progression Continue to progress workloads to maintain intensity without signs/symptoms of physical distress.     Resistance Training   Training Prescription Yes   Weight orange bands   Reps 10-12  10 minutes of strength training     Interval Training    Interval Training No     Bike   Level 4   Minutes 17     Track   Laps 11   Minutes 17     Goals Met:  Independence with exercise equipment Improved SOB with ADL's Using PLB without cueing & demonstrates good technique Exercise tolerated well No report of cardiac concerns or symptoms Strength training completed today  Goals Unmet:  Not Applicable  Comments: Service time is from 1330 to 1505   Dr. Rush Farmer is Medical Director for Pulmonary Rehab at [facility]

## 2016-05-05 ENCOUNTER — Encounter (HOSPITAL_COMMUNITY)
Admission: RE | Admit: 2016-05-05 | Discharge: 2016-05-05 | Disposition: A | Discharge status: Home / Self Care | Payer: Medicare Other | Source: Ambulatory Visit | Attending: Pulmonary Disease | Admitting: Pulmonary Disease

## 2016-05-05 VITALS — Wt 169.3 lb

## 2016-05-05 DIAGNOSIS — R0602 Shortness of breath: Secondary | ICD-10-CM | POA: Diagnosis present

## 2016-05-05 NOTE — Progress Notes (Signed)
Daily Session Note  Patient Details  Name: Krystal Hopkins MRN: [id number] Date of Birth: [birth year] Referring Provider:   April Manson Pulmonary Rehab Walk Test from 01/23/2016 in [place]  Referring Provider  Dr. Einar Gip      Encounter Date: 05/05/2016  Check In:     Session Check In - 05/05/16 1338      Check-In   [facility]   Staff Present Su Hilt, MS, ACSM RCEP, Exercise Physiologist;Aracelie Addis Leonia Reeves, RN, BSN;Lisa Ysidro Evert, RN;Portia Rollene Rotunda, RN, BSN   Supervising physician immediately available to respond to emergencies [facility] immediately available   Physician(s) Dr. Marily Memos   Medication changes reported     No   Fall or balance concerns reported    No   Warm-up and Cool-down Performed as group-led instruction   Resistance Training Performed Yes   VAD Patient? No     Pain Assessment   Currently in Pain? No/denies   Multiple Pain Sites No      Capillary Blood Glucose: No results found for this or any previous visit (from the past 24 hour(s)).      Exercise Prescription Changes - 05/05/16 1500      Response to Exercise   Blood Pressure (Admit) 122/70   Blood Pressure (Exercise) 120/62   Blood Pressure (Exit) 96/50   Heart Rate (Admit) 63 bpm   Heart Rate (Exercise) 94 bpm   Heart Rate (Exit) 82 bpm   Oxygen Saturation (Admit) 99 %   Oxygen Saturation (Exercise) 94 %   Oxygen Saturation (Exit) 99 %   Rating of Perceived Exertion (Exercise) 13   Perceived Dyspnea (Exercise) 1   Duration Progress to 45 minutes of aerobic exercise without signs/symptoms of physical distress   Intensity THRR unchanged     Progression   Progression Continue to progress workloads to maintain intensity without signs/symptoms of physical distress.     Resistance Training   Training Prescription Yes   Weight orange bands   Reps 10-12  10 minutes of strength training     Interval Training   Interval  Training No     Bike   Level 4   Minutes 17     NuStep   Level 6   Minutes 17   METs 3.2     Track   Laps 15   Minutes 17     Goals Met:  Independence with exercise equipment Using PLB without cueing & demonstrates good technique Exercise tolerated well Strength training completed today  Goals Unmet:  Not Applicable  Comments: Service time is from 1330 to 1500   Dr. Rush Farmer is Medical Director for Pulmonary Rehab at [facility].

## 2016-05-07 ENCOUNTER — Encounter (HOSPITAL_COMMUNITY)
Admission: RE | Admit: 2016-05-07 | Discharge: 2016-05-07 | Disposition: A | Discharge status: Home / Self Care | Payer: Medicare Other | Source: Ambulatory Visit | Attending: Pulmonary Disease | Admitting: Pulmonary Disease

## 2016-05-07 VITALS — Wt 167.1 lb

## 2016-05-07 DIAGNOSIS — R0602 Shortness of breath: Secondary | ICD-10-CM

## 2016-05-07 NOTE — Progress Notes (Signed)
Daily Session Note  Patient Details  Name: Krystal Hopkins MRN: [id number] Date of Birth: 1931/10/14 Referring Provider:   April Manson Pulmonary Rehab Walk Test from 01/23/2016 in [place]  Referring Provider  Dr. Einar Gip      Encounter Date: 05/07/2016  Check In:     Session Check In - 05/07/16 1351      Check-In   Location [facility]   Staff Present Rosebud Poles, RN, BSN;Molly diVincenzo, MS, ACSM RCEP, Exercise Physiologist;Caroline Longie Ysidro Evert, RN;Portia Rollene Rotunda, RN, BSN   Supervising physician immediately available to respond to emergencies [facility] immediately available   Physician(s) Dr. Marily Memos   Medication changes reported     No   Fall or balance concerns reported    No   Warm-up and Cool-down Performed as group-led instruction   Resistance Training Performed Yes   VAD Patient? No     Pain Assessment   Currently in Pain? No/denies   Multiple Pain Sites No      Capillary Blood Glucose: No results found for this or any previous visit (from the past 24 hour(s)).      Exercise Prescription Changes - 05/07/16 1500      Response to Exercise   Blood Pressure (Admit) 108/60   Blood Pressure (Exercise) 124/60   Blood Pressure (Exit) 98/60   Heart Rate (Admit) 69 bpm   Heart Rate (Exercise) 117 bpm   Heart Rate (Exit) 84 bpm   Oxygen Saturation (Admit) 97 %   Oxygen Saturation (Exercise) 97 %   Oxygen Saturation (Exit) 96 %   Rating of Perceived Exertion (Exercise) 13   Perceived Dyspnea (Exercise) 2   Duration Progress to 45 minutes of aerobic exercise without signs/symptoms of physical distress   Intensity THRR unchanged     Progression   Progression Continue to progress workloads to maintain intensity without signs/symptoms of physical distress.     Resistance Training   Training Prescription Yes   Weight orange bands   Reps 10-12  10 minutes of strength training     Interval Training   Interval  Training No     NuStep   Level 6   Minutes 17   METs 3.3     Track   Laps 16   Minutes 17     Goals Met:  Exercise tolerated well No report of cardiac concerns or symptoms Strength training completed today  Goals Unmet:  Not Applicable  Comments: Service time is from 1330 to 1510. She attended Meditation class today with Jeanella Craze.    Dr. Rush Farmer is Medical Director for Pulmonary Rehab at [facility]

## 2016-05-12 ENCOUNTER — Encounter (HOSPITAL_COMMUNITY)
Admission: RE | Admit: 2016-05-12 | Discharge: 2016-05-12 | Disposition: A | Discharge status: Home / Self Care | Payer: Medicare Other | Source: Ambulatory Visit | Attending: Pulmonary Disease | Admitting: Pulmonary Disease

## 2016-05-12 VITALS — Wt 167.1 lb

## 2016-05-12 DIAGNOSIS — R0602 Shortness of breath: Secondary | ICD-10-CM

## 2016-05-12 NOTE — Progress Notes (Signed)
Daily Session Note  Patient Details  Name: Krystal Hopkins MRN: [id number] Date of Birth: 11/28/1931 Referring Provider:   April Manson Pulmonary Rehab Walk Test from 01/23/2016 in [place]  Referring Provider  Dr. Einar Gip      Encounter Date: 05/12/2016  Check In:     Session Check In - 05/12/16 1339      Check-In   Location [facility]   Staff Present Rosebud Poles, RN, BSN;Lisa Ysidro Evert, Felipe Drone, RN, MHA;Molly diVincenzo, MS, ACSM RCEP, Exercise Physiologist   Supervising physician immediately available to respond to emergencies [facility] immediately available   Physician(s) Dr. Marily Memos   Warm-up and Cool-down Performed as group-led instruction   Resistance Training Performed Yes   VAD Patient? No     Pain Assessment   Currently in Pain? No/denies   Multiple Pain Sites Yes      Capillary Blood Glucose: No results found for this or any previous visit (from the past 24 hour(s)).      Exercise Prescription Changes - 05/12/16 1600      Response to Exercise   Blood Pressure (Admit) 120/72   Blood Pressure (Exercise) 106/64   Blood Pressure (Exit) 100/60   Heart Rate (Admit) 64 bpm   Heart Rate (Exercise) 107 bpm   Heart Rate (Exit) 82 bpm   Oxygen Saturation (Admit) 97 %   Oxygen Saturation (Exercise) 97 %   Oxygen Saturation (Exit) 94 %   Rating of Perceived Exertion (Exercise) 11   Perceived Dyspnea (Exercise) 1   Duration Progress to 45 minutes of aerobic exercise without signs/symptoms of physical distress   Intensity THRR unchanged     Progression   Progression Continue to progress workloads to maintain intensity without signs/symptoms of physical distress.     Resistance Training   Training Prescription Yes   Weight orange bands   Reps 10-12  10 minutes of strength training     Interval Training   Interval Training No     Bike   Level 4   Minutes 17     NuStep   Level 6   Minutes 17   METs 3     Track   Laps 16   Minutes 17     Goals Met:  Independence with exercise equipment Improved SOB with ADL's Using PLB without cueing & demonstrates good technique Exercise tolerated well Strength training completed today  Goals Unmet:  Not Applicable  Comments: Service time is from 1330 to 1500    Dr. Rush Farmer is Medical Director for Pulmonary Rehab at [facility].

## 2016-05-14 ENCOUNTER — Encounter (HOSPITAL_COMMUNITY)
Admission: RE | Admit: 2016-05-14 | Discharge: 2016-05-14 | Disposition: A | Discharge status: Home / Self Care | Payer: Medicare Other | Source: Ambulatory Visit | Attending: Pulmonary Disease | Admitting: Pulmonary Disease

## 2016-05-14 DIAGNOSIS — R0602 Shortness of breath: Secondary | ICD-10-CM

## 2016-05-14 NOTE — Progress Notes (Signed)
Pulmonary Individual Treatment Plan  Patient Details  Name: Krystal Hopkins MRN: [id number] Date of Birth: 01-25-1932 Referring Provider:   April Manson Pulmonary Rehab Walk Test from 01/23/2016 in [place]  Referring Provider  Dr. Einar Gip      Initial Encounter Date:  Flowsheet Row Pulmonary Rehab Walk Test from 01/23/2016 in [place]  Date  01/27/16  Referring Provider  Dr. Einar Gip      Visit Diagnosis: Shortness of breath  Patient's Home Medications on Admission:   Current Outpatient Prescriptions:  .  aspirin EC 81 MG tablet, Take 81 mg by mouth every morning., Disp: , Rfl:  .  BENICAR HCT 20-12.5 MG per tablet, Take 1 tablet by mouth daily. Reported on 01/20/2016, Disp: , Rfl:  .  Calcium Carbonate (CALCIUM 600 PO), Take 1 tablet by mouth daily., Disp: , Rfl:  .  cetirizine (ZYRTEC) 10 MG tablet, Take 10 mg by mouth daily., Disp: , Rfl:  .  levothyroxine (SYNTHROID, LEVOTHROID) 100 MCG tablet, Take 100 mcg by mouth every morning., Disp: , Rfl:  .  Multiple Vitamin (MULITIVITAMIN WITH MINERALS) TABS, Take 1 tablet by mouth every morning., Disp: , Rfl:  .  Omega-3 Fatty Acids (FISH OIL PO), Take 1 tablet by mouth daily., Disp: , Rfl:  .  pravastatin (PRAVACHOL) 20 MG tablet, Take 20 mg by mouth daily., Disp: , Rfl:  .  valsartan-hydrochlorothiazide (DIOVAN-HCT) 80-12.5 MG tablet, Take 1 tablet by mouth daily., Disp: , Rfl:  .  vitamin C (ASCORBIC ACID) 500 MG tablet, Take 500 mg by mouth daily. Reported on 01/20/2016, Disp: , Rfl:  .  VITAMIN D, CHOLECALCIFEROL, PO, Take 1 tablet by mouth daily., Disp: , Rfl:  .  vitamin E 100 UNIT capsule, Take 100 Units by mouth daily. Reported on 01/20/2016, Disp: , Rfl:   Past Medical History: Past Medical History:  Diagnosis Date  . Blood transfusion without reported diagnosis   . Cataract   . Colon polyp 2014   TUBULAR ADENOMA.  . Hyperlipidemia   . Hypertension   .  Hypothyroidism   . Osteoporosis     Tobacco Use: History  Smoking Status  . Former Smoker  . Packs/day: 1.00  . Years: 20.00  . Types: Cigarettes  . Quit date: 10/24/1970  Smokeless Tobacco  . Never Used    Labs: Recent Review Flowsheet Data    There is no flowsheet data to display.      Capillary Blood Glucose: No results found for: GLUCAP   ADL UCSD:     Pulmonary Assessment Scores    Row Name 01/30/16 1605         ADL UCSD   ADL Phase Entry     SOB Score total 31        Pulmonary Function Assessment:     Pulmonary Function Assessment - 01/20/16 1247      Breath   Bilateral Breath Sounds Clear   Shortness of Breath Yes;Fear of Shortness of Breath;Limiting activity;Panic with Shortness of Breath      Exercise Target Goals:    Exercise Program Goal: Individual exercise prescription set with THRR, safety & activity barriers. Participant demonstrates ability to understand and report RPE using BORG scale, to self-measure pulse accurately, and to acknowledge the importance of the exercise prescription.  Exercise Prescription Goal: Starting with aerobic activity 30 plus minutes a day, 3 days per week for initial exercise prescription. Provide home exercise prescription and guidelines that  participant acknowledges understanding prior to discharge.  Activity Barriers & Risk Stratification:     Activity Barriers & Cardiac Risk Stratification - 01/20/16 1246      Activity Barriers & Cardiac Risk Stratification   Activity Barriers Joint Problems;Deconditioning;Muscular Weakness;Shortness of Breath      6 Minute Walk:     6 Minute Walk    Row Name 01/27/16 0928         6 Minute Walk   Phase Initial     Distance 1200 feet     Walk Time 6 minutes     # of Rest Breaks 0     MPH 2.27     METS 1.89     RPE 12     Perceived Dyspnea  1     VO2 Peak 6.6     Symptoms No     Resting HR 80 bpm     Resting BP 108/80     Max Ex. HR 101 bpm     Max  Ex. BP 140/72       Interval HR   Baseline HR 80     2 Minute HR 99     4 Minute HR 100     6 Minute HR 99     Interval Heart Rate? Yes       Interval Oxygen   Baseline Oxygen Saturation % 99 %     Baseline Liters of Oxygen 0 L     1 Minute Oxygen Saturation % 99 %     1 Minute Liters of Oxygen 0 L     2 Minute Oxygen Saturation % 99 %     2 Minute Liters of Oxygen 0 L     3 Minute Oxygen Saturation % 99 %     3 Minute Liters of Oxygen 0 L     4 Minute Oxygen Saturation % 99 %     4 Minute Liters of Oxygen 0 L     5 Minute Oxygen Saturation % 98 %     5 Minute Liters of Oxygen 0 L     6 Minute Oxygen Saturation % 99 %     6 Minute Liters of Oxygen 0 L     2 Minute Post Oxygen Saturation % 98 %     2 Minute Post Liters of Oxygen 0 L        Initial Exercise Prescription:     Initial Exercise Prescription - 01/27/16 0900      Date of Initial Exercise RX and Referring Provider   Date 01/27/16   Referring Provider Dr. Einar Gip     Oxygen   Oxygen --  room air     Bike   Level 2   Minutes 15     NuStep   Level 2   Minutes 15   METs 1.5     Track   Laps 11   Minutes 15     Prescription Details   Frequency (times per week) 2   Duration Progress to 45 minutes of aerobic exercise without signs/symptoms of physical distress     Intensity   THRR 40-80% of Max Heartrate 56-111   Ratings of Perceived Exertion 11-13   Perceived Dyspnea 0-4     Progression   Progression Continue progressive overload as per policy without signs/symptoms or physical distress.     Resistance Training   Training Prescription Yes   Weight orange band   Reps 10-12  Perform Capillary Blood Glucose checks as needed.  Exercise Prescription Changes:     Exercise Prescription Changes    Row Name 01/30/16 1500 02/06/16 1600 02/11/16 1500 02/13/16 1645 02/20/16 1600     Exercise Review   Progression Yes Yes Yes  -  -     Response to Exercise   Blood Pressure (Admit) 110/60  110/60 128/70 108/60 118/78   Blood Pressure (Exercise) 116/60 116/60 114/60 100/60 100/60   Blood Pressure (Exit) 112/60 112/60 100/50 106/84 94/56   Heart Rate (Admit) 76 bpm 76 bpm 62 bpm 66 bpm 72 bpm   Heart Rate (Exercise) 95 bpm 95 bpm 111 bpm 91 bpm 92 bpm   Heart Rate (Exit) 81 bpm 81 bpm 77 bpm 65 bpm 78 bpm   Oxygen Saturation (Admit) 99 % 99 % 96 % 98 % 99 %   Oxygen Saturation (Exercise) 96 % 96 % 97 % 99 % 98 %   Oxygen Saturation (Exit) 97 % 97 % 95 % 100 % 97 %   Rating of Perceived Exertion (Exercise) 11 11 13 13 11    Perceived Dyspnea (Exercise) 1 1 1 1 1    Comments  - reviewed home exercise prescription with patient  -  -  -   Duration  -  - Progress to 45 minutes of aerobic exercise without signs/symptoms of physical distress Progress to 45 minutes of aerobic exercise without signs/symptoms of physical distress Progress to 45 minutes of aerobic exercise without signs/symptoms of physical distress   Intensity  -  - THRR unchanged THRR unchanged THRR unchanged     Progression   Progression Continue to progress workloads to maintain intensity without signs/symptoms of physical distress. Continue to progress workloads to maintain intensity without signs/symptoms of physical distress. Continue to progress workloads to maintain intensity without signs/symptoms of physical distress. Continue to progress workloads to maintain intensity without signs/symptoms of physical distress. Continue to progress workloads to maintain intensity without signs/symptoms of physical distress.     Resistance Training   Training Prescription Yes Yes Yes Yes Yes   Weight orange band orange band orange bands' orange bands' orange bands'   Reps 10-12 10-12 10-12 10-12 10-12     Interval Training   Interval Training No No No No No     Bike   Level 3 3 3 3   -   Minutes 15 15 15 15   -     NuStep   Level 2 2 4 4 4    Minutes 15 15 15 15 15    METs 2.4 2.4 2.8 2.9 2.7     Track   Laps 12 12 13   -  14   Minutes 15 15 15   - 15   Row Name 02/25/16 1551 02/27/16 1500 03/03/16 1500 03/05/16 1600 03/10/16 1500     Exercise Review   Progression Yes  -  -  - Yes     Response to Exercise   Blood Pressure (Admit) 130/64 122/68 122/64 108/60 110/60   Blood Pressure (Exercise) 120/60 130/60 132/62 130/60 130/68   Blood Pressure (Exit) 110/62 114/64 98/62 110/60 134/72   Heart Rate (Admit) 67 bpm 61 bpm 66 bpm 71 bpm 64 bpm   Heart Rate (Exercise) 88 bpm 108 bpm 97 bpm 102 bpm 99 bpm   Heart Rate (Exit) 67 bpm 73 bpm 71 bpm 63 bpm 71 bpm   Oxygen Saturation (Admit) 98 % 99 % 96 % 99 % 98 %   Oxygen Saturation (Exercise)  97 % 97 % 100 % 95 % 98 %   Oxygen Saturation (Exit) 97 % 100 % 97 % 96 % 97 %   Rating of Perceived Exertion (Exercise) 13 15 13 11 13    Perceived Dyspnea (Exercise) 3 3 2 1 2    Duration Progress to 45 minutes of aerobic exercise without signs/symptoms of physical distress Progress to 45 minutes of aerobic exercise without signs/symptoms of physical distress Progress to 45 minutes of aerobic exercise without signs/symptoms of physical distress Progress to 45 minutes of aerobic exercise without signs/symptoms of physical distress Progress to 45 minutes of aerobic exercise without signs/symptoms of physical distress   Intensity THRR unchanged THRR unchanged THRR unchanged THRR unchanged THRR unchanged     Progression   Progression Continue to progress workloads to maintain intensity without signs/symptoms of physical distress. Continue to progress workloads to maintain intensity without signs/symptoms of physical distress. Continue to progress workloads to maintain intensity without signs/symptoms of physical distress. Continue to progress workloads to maintain intensity without signs/symptoms of physical distress. Continue to progress workloads to maintain intensity without signs/symptoms of physical distress.     Resistance Training   Training Prescription Yes Yes Yes Yes Yes    Weight orange bands' orange bands orange bands orange bands orange bands   Reps 10-12 10-12 10-12 10-12 10-12     Interval Training   Interval Training No No No No No     Bike   Level 3 3 3   - 4   Minutes 15 15 15   - 15     NuStep   Level 5  - 5 5 6    Minutes 15  - 15 15 15    METs 2.1  - 3.2 3.3 3.1     Track   Laps 14 13 13 13 15    Minutes 15 15 15 15 15    Row Name 03/12/16 1500 03/17/16 1559 03/24/16 1600 03/26/16 1500 03/31/16 1500     Response to Exercise   Blood Pressure (Admit) 102/54 92/66 120/64 110/60 112/60   Blood Pressure (Exercise) 98/70 104/60 108/64 124/62 100/46   Blood Pressure (Exit) 98/60 104/64 90/60 104/60 98/56   Heart Rate (Admit) 65 bpm 72 bpm 77 bpm 65 bpm 67 bpm   Heart Rate (Exercise) 98 bpm 99 bpm 108 bpm 105 bpm 106 bpm   Heart Rate (Exit) 76 bpm 76 bpm 82 bpm 70 bpm 75 bpm   Oxygen Saturation (Admit) 92 % 97 % 97 % 100 % 100 %   Oxygen Saturation (Exercise) 97 % 96 % 96 % 97 % 96 %   Oxygen Saturation (Exit) 98 % 94 % 96 % 97 % 95 %   Rating of Perceived Exertion (Exercise) 15 15 14 15 15    Perceived Dyspnea (Exercise) 3 2 2 2 2    Duration Progress to 45 minutes of aerobic exercise without signs/symptoms of physical distress Progress to 45 minutes of aerobic exercise without signs/symptoms of physical distress Progress to 45 minutes of aerobic exercise without signs/symptoms of physical distress Progress to 45 minutes of aerobic exercise without signs/symptoms of physical distress Progress to 45 minutes of aerobic exercise without signs/symptoms of physical distress   Intensity THRR unchanged THRR unchanged THRR unchanged THRR unchanged THRR unchanged     Progression   Progression Continue to progress workloads to maintain intensity without signs/symptoms of physical distress. Continue to progress workloads to maintain intensity without signs/symptoms of physical distress. Continue to progress workloads to maintain intensity without signs/symptoms of  physical distress. Continue to progress workloads to maintain intensity without signs/symptoms of physical distress. Continue to progress workloads to maintain intensity without signs/symptoms of physical distress.     Resistance Training   Training Prescription Yes Yes Yes Yes Yes   Weight orange bands orange bands orange bands orange bands ornage bands   Reps 10-12 10-12 10-12 10-12 10-12     Interval Training   Interval Training No No No No No     Bike   Level 4 4 4 4 4    Minutes 15 15 15 15 17      NuStep   Level  - 6 6 6 6    Minutes  - 15 15 15 17    METs  - 2.2 3.2 3.2 3.2     Track   Laps 11 15 11 13 14    Minutes 15 15 15 15 17    Row Name 04/09/16 1600 04/21/16 1500 04/23/16 1631 04/28/16 1500 04/30/16 1547     Response to Exercise   Blood Pressure (Admit) 122/62 122/66 126/70 100/52 98/60   Blood Pressure (Exercise) 138/70 120/70 130/68 100/50 108/62   Blood Pressure (Exit) 106/60 94/64 116/60 110/64 106/58   Heart Rate (Admit) 64 bpm 78 bpm 70 bpm 80 bpm 63 bpm   Heart Rate (Exercise) 109 bpm 120 bpm 106 bpm 112 bpm 105 bpm   Heart Rate (Exit) 77 bpm 78 bpm 76 bpm 84 bpm 60 bpm   Oxygen Saturation (Admit) 99 % 95 % 100 % 99 % 100 %   Oxygen Saturation (Exercise) 96 % 96 % 96 % 97 % 97 %   Oxygen Saturation (Exit) 97 % 94 % 93 % 97 % 97 %   Rating of Perceived Exertion (Exercise) 14 14 13 13 13    Perceived Dyspnea (Exercise) 3 3 3 3 3    Duration Progress to 45 minutes of aerobic exercise without signs/symptoms of physical distress Progress to 45 minutes of aerobic exercise without signs/symptoms of physical distress Progress to 45 minutes of aerobic exercise without signs/symptoms of physical distress Progress to 45 minutes of aerobic exercise without signs/symptoms of physical distress Progress to 45 minutes of aerobic exercise without signs/symptoms of physical distress   Intensity THRR unchanged THRR unchanged THRR unchanged THRR unchanged THRR unchanged      Progression   Progression Continue to progress workloads to maintain intensity without signs/symptoms of physical distress. Continue to progress workloads to maintain intensity without signs/symptoms of physical distress. Continue to progress workloads to maintain intensity without signs/symptoms of physical distress. Continue to progress workloads to maintain intensity without signs/symptoms of physical distress. Continue to progress workloads to maintain intensity without signs/symptoms of physical distress.     Resistance Training   Training Prescription Yes Yes Yes Yes Yes   Weight ornage bands orange bands orange bands orange bands orange bands   Reps 10-12 10-12 10-12  10 min 10-12  10 minutes of strength training 10-12  10 minutes of strength training     Interval Training   Interval Training No No No No No     Bike   Level  - 4 4 4 4    Minutes  - 17 17 17 17      NuStep   Level 6 6 6 6   -   Minutes 17 17 17 17   -   METs 2.3 3 3 3   -     Track   Laps 14 15  - 13 11   Minutes 17 17  -  [place] Name 05/05/16 1500 05/07/16 1500 05/12/16 1600         Response to Exercise   Blood Pressure (Admit) 122/70 108/60 120/72     Blood Pressure (Exercise) 120/62 124/60 106/64     Blood Pressure (Exit) 96/50 98/60 100/60     Heart Rate (Admit) 63 bpm 69 bpm 64 bpm     Heart Rate (Exercise) 94 bpm 117 bpm 107 bpm     Heart Rate (Exit) 82 bpm 84 bpm 82 bpm     Oxygen Saturation (Admit) 99 % 97 % 97 %     Oxygen Saturation (Exercise) 94 % 97 % 97 %     Oxygen Saturation (Exit) 99 % 96 % 94 %     Rating of Perceived Exertion (Exercise) 13 13 11      Perceived Dyspnea (Exercise) 1 2 1      Duration Progress to 45 minutes of aerobic exercise without signs/symptoms of physical distress Progress to 45 minutes of aerobic exercise without signs/symptoms of physical distress Progress to 45 minutes of aerobic exercise without signs/symptoms of physical distress     Intensity THRR unchanged  THRR unchanged THRR unchanged       Progression   Progression Continue to progress workloads to maintain intensity without signs/symptoms of physical distress. Continue to progress workloads to maintain intensity without signs/symptoms of physical distress. Continue to progress workloads to maintain intensity without signs/symptoms of physical distress.       Resistance Training   Training Prescription Yes Yes Yes     Weight orange bands orange bands orange bands     Reps 10-12  10 minutes of strength training 10-12  10 minutes of strength training 10-12  10 minutes of strength training       Interval Training   Interval Training No No No       Bike   Level 4  - 4     Minutes 17  - 17       NuStep   Level 6 6 6      Minutes 17 17 17      METs 3.2 3.3 3       Track   Laps 15 16 16      Minutes 17 17 17         Exercise Comments:     Exercise Comments    Row Name 02/06/16 1623 02/20/16 0944 03/19/16 0840 04/16/16 0845     Exercise Comments reviewed home exercise prescription with patient Patient has only had 4 exercise sessions but is progressing. Will continue to monitor and progress as appropriate. Patient is increasing well in her exercise intensity. Has increased lap walking from 12 to 15 laps, nustep level 2 to 6. Will cont. to monitor and progress intensity here at rehab and at home.  Continues to tolerate exercise well, continue to increase workloads as tolerated.       Discharge Exercise Prescription (Final Exercise Prescription Changes):     Exercise Prescription Changes - 05/12/16 1600      Response to Exercise   Blood Pressure (Admit) 120/72   Blood Pressure (Exercise) 106/64   Blood Pressure (Exit) 100/60   Heart Rate (Admit) 64 bpm   Heart Rate (Exercise) 107 bpm   Heart Rate (Exit) 82 bpm   Oxygen Saturation (Admit) 97 %   Oxygen Saturation (Exercise) 97 %   Oxygen Saturation (Exit) 94 %   Rating of Perceived Exertion (Exercise) 11   Perceived Dyspnea  (Exercise)  1   Duration Progress to 45 minutes of aerobic exercise without signs/symptoms of physical distress   Intensity THRR unchanged     Progression   Progression Continue to progress workloads to maintain intensity without signs/symptoms of physical distress.     Resistance Training   Training Prescription Yes   Weight orange bands   Reps 10-12  10 minutes of strength training     Interval Training   Interval Training No     Bike   Level 4   Minutes 17     NuStep   Level 6   Minutes 17   METs 3     Track   Laps 16   Minutes 17       Nutrition:  Target Goals: Understanding of nutrition guidelines, daily intake of sodium 1500mg , cholesterol 200mg , calories 30% from fat and 7% or less from saturated fats, daily to have 5 or more servings of fruits and vegetables.  Biometrics:     Pre Biometrics - 01/20/16 1255      Pre Biometrics   Grip Strength 18 kg       Nutrition Therapy Plan and Nutrition Goals:   Nutrition Discharge: Rate Your Plate Scores:     Nutrition Assessments - 02/20/16 1523      Rate Your Plate Scores   Pre Score 55      Psychosocial: Target Goals: Acknowledge presence or absence of depression, maximize coping skills, provide positive support system. Participant is able to verbalize types and ability to use techniques and skills needed for reducing stress and depression.  Initial Review & Psychosocial Screening:     Initial Psych Review & Screening - 01/20/16 1308      Family Dynamics   Good Support System? Yes   Comments --  daughter and grandson are VERY supportive     Barriers   Psychosocial barriers to participate in program There are no identifiable barriers or psychosocial needs.     Screening Interventions   Interventions Encouraged to exercise      Quality of Life Scores:     Quality of Life - 01/30/16 1604      Quality of Life Scores   Health/Function Pre 26.14 %   Socioeconomic Pre 30 %    Psych/Spiritual Pre 25.14 %   Family Pre 25.38 %   GLOBAL Pre 26.67 %      PHQ-9: Recent Review Flowsheet Data    Depression screen [facility] 2/9 01/20/2016 05/03/2013   Decreased Interest 0 0   Down, Depressed, Hopeless 0 1   PHQ - 2 Score 0 1      Psychosocial Evaluation and Intervention:     Psychosocial Evaluation - 01/20/16 1309      Psychosocial Evaluation & Interventions   Interventions Encouraged to exercise with the program and follow exercise prescription   Comments Good psychological outlook.   Continued Psychosocial Services Needed No      Psychosocial Re-Evaluation:     Psychosocial Re-Evaluation    [place] Name 03/10/16 1130 04/13/16 1153 05/12/16 0821         Psychosocial Re-Evaluation   Interventions Encouraged to attend Pulmonary Rehabilitation for the exercise Encouraged to attend Pulmonary Rehabilitation for the exercise -  none     Comments Continues to have no psychosocial concerns. No psychosocial concerns identified at this time. -  no psychosocial concerns identified     Continued Psychosocial Services Needed No No No       Education: Education Goals: Education classes  will be provided on a weekly basis, covering required topics. Participant will state understanding/return demonstration of topics presented.  Learning Barriers/Preferences:     Learning Barriers/Preferences - 01/20/16 1246      Learning Barriers/Preferences   Learning Barriers None   Learning Preferences Audio;Group Instruction;Computer/Internet;Pictoral;Skilled Demonstration;Verbal Instruction;Video;Written Material;Individual Instruction      Education Topics: Risk Factor Reduction:  -Group instruction that is supported by a PowerPoint presentation. Instructor discusses the definition of a risk factor, different risk factors for pulmonary disease, and how the heart and lungs work together.     Nutrition for Pulmonary Patient:  -Group instruction provided by PowerPoint slides,  verbal discussion, and written materials to support subject matter. The instructor gives an explanation and review of healthy diet recommendations, which includes a discussion on weight management, recommendations for fruit and vegetable consumption, as well as protein, fluid, caffeine, fiber, sodium, sugar, and alcohol. Tips for eating when patients are short of breath are discussed. Flowsheet Row PULMONARY REHAB OTHER RESPIRATORY from 04/30/2016 in [place]  Date  03/05/16  Educator  RD  Instruction Review Code  2- meets goals/outcomes      Pursed Lip Breathing:  -Group instruction that is supported by demonstration and informational handouts. Instructor discusses the benefits of pursed lip and diaphragmatic breathing and detailed demonstration on how to preform both.   Flowsheet Row PULMONARY REHAB OTHER RESPIRATORY from 04/30/2016 in [place]  Date  04/30/16  Educator  EP  Instruction Review Code  R- Review/reinforce      Oxygen Safety:  -Group instruction provided by PowerPoint, verbal discussion, and written material to support subject matter. There is an overview of "What is Oxygen" and "Why do we need it".  Instructor also reviews how to create a safe environment for oxygen use, the importance of using oxygen as prescribed, and the risks of noncompliance. There is a brief discussion on traveling with oxygen and resources the patient may utilize.   Oxygen Equipment:  -Group instruction provided by [facility] Staff utilizing handouts, written materials, and equipment demonstrations.   Signs and Symptoms:  -Group instruction provided by written material and verbal discussion to support subject matter. Warning signs and symptoms of infection, stroke, and heart attack are reviewed and when to call the physician/911 reinforced. Tips for preventing the spread of infection discussed.   Advanced Directives:  -Group  instruction provided by verbal instruction and written material to support subject matter. Instructor reviews Advanced Directive laws and proper instruction for filling out document. Flowsheet Row PULMONARY REHAB OTHER RESPIRATORY from 04/30/2016 in [place]  Date  02/20/16  Educator  Jeanella Craze  Instruction Review Code  2- meets goals/outcomes      Pulmonary Video:  -Group video education that reviews the importance of medication and oxygen compliance, exercise, good nutrition, pulmonary hygiene, and pursed lip and diaphragmatic breathing for the pulmonary patient. Flowsheet Row PULMONARY REHAB OTHER RESPIRATORY from 04/30/2016 in [place]  Date  03/12/16  Instruction Review Code  2- meets goals/outcomes      Exercise for the Pulmonary Patient:  -Group instruction that is supported by a PowerPoint presentation. Instructor discusses benefits of exercise, core components of exercise, frequency, duration, and intensity of an exercise routine, importance of utilizing pulse oximetry during exercise, safety while exercising, and options of places to exercise outside of rehab.   Flowsheet Row PULMONARY REHAB OTHER RESPIRATORY from 04/30/2016 in [place]  [place]  Date  04/09/16  Educator  EP  Instruction Review Code  2- meets goals/outcomes      Pulmonary Medications:  -Verbally interactive group education provided by instructor with focus on inhaled medications and proper administration.   Anatomy and Physiology of the Respiratory System and Intimacy:  -Group instruction provided by PowerPoint, verbal discussion, and written material to support subject matter. Instructor reviews respiratory cycle and anatomical components of the respiratory system and their functions. Instructor also reviews differences in obstructive and restrictive respiratory diseases with examples of each. Intimacy, Sex, and  Sexuality differences are reviewed with a discussion on how relationships can change when diagnosed with pulmonary disease. Common sexual concerns are reviewed. Flowsheet Row PULMONARY REHAB OTHER RESPIRATORY from 04/30/2016 in [place]  Date  04/23/16  Educator  RN  Instruction Review Code  2- meets goals/outcomes      Knowledge Questionnaire Score:     Knowledge Questionnaire Score - 01/30/16 1604      Knowledge Questionnaire Score   Pre Score 9/13      Core Components/Risk Factors/Patient Goals at Admission:     Personal Goals and Risk Factors at Admission - 01/20/16 1257      Core Components/Risk Factors/Patient Goals on Admission   Increase Strength and Stamina Yes   Intervention Provide advice, education, support and counseling about physical activity/exercise needs.;Develop an individualized exercise prescription for aerobic and resistive training based on initial evaluation findings, risk stratification, comorbidities and participant's personal goals.   Expected Outcomes Achievement of increased cardiorespiratory fitness and enhanced flexibility, muscular endurance and strength shown through measurements of functional capacity and personal statement of participant.   Improve shortness of breath with ADL's Yes   Intervention Provide education, individualized exercise plan and daily activity instruction to help decrease symptoms of SOB with activities of daily living.   Expected Outcomes Short Term: Achieves a reduction of symptoms when performing activities of daily living.   Develop more efficient breathing techniques such as purse lipped breathing and diaphragmatic breathing; and practicing self-pacing with activity Yes   Intervention Provide education, demonstration and support about specific breathing techniuqes utilized for more efficient breathing. Include techniques such as pursed lipped breathing, diaphragmatic breathing and self-pacing  activity.   Expected Outcomes Short Term: Participant will be able to demonstrate and use breathing techniques as needed throughout daily activities.      Core Components/Risk Factors/Patient Goals Review:      Goals and Risk Factor Review    Row Name 01/20/16 1304 03/10/16 1125 04/13/16 1149 05/12/16 0818       Core Components/Risk Factors/Patient Goals Review   Personal Goals Review Increase Strength and Stamina;Improve shortness of breath with ADL's  - Increase Strength and Stamina;Improve shortness of breath with ADL's  -    Review increase workloads as tolerated by patient She is attending exercise classes regularly and is feeling stronger.  She is mid way through the program. Feeling much stronger, but at 82 y/o she has not seen the progress she had when she went through tthe program 3 years ago.  Tolerates exercise better on flat surfaces and in heated/ air conditioned environment. definite increase in strength and stamina, has lost 2 kg, workloads continue to be increased.  Has 2 exercise sessions left in program.    Expected Outcomes increased strength, stamina and less shortness of breath with exercise and ADL's. To continue to imcrease her strength and stamina as we increase her workloads She  should graduate in the next 30 days, continue to increase workloads as tolerated. graduation in 2 sessions, should see a marked improvement since start of program.       Core Components/Risk Factors/Patient Goals at Discharge (Final Review):      Goals and Risk Factor Review - 05/12/16 0818      Core Components/Risk Factors/Patient Goals Review   Review definite increase in strength and stamina, has lost 2 kg, workloads continue to be increased.  Has 2 exercise sessions left in program.   Expected Outcomes graduation in 2 sessions, should see a marked improvement since start of program.      ITP Comments:   Comments: ITP REVIEW Pt is making expected progress toward personal goals  after completing 23 sessions.   Recommend continued exercise, life style modification, education, and utilization of breathing techniques to increase stamina and strength and decrease shortness of breath with exertion.

## 2016-06-11 NOTE — Progress Notes (Signed)
Discharge Summary  Patient Details  Name: Krystal Hopkins MRN: [id number] Date of Birth: 1932-07-11 Referring Provider:   April Manson [facility] Walk Test from 01/23/2016 in [place]  Referring Provider  Dr. Einar Gip       Number of Visits: 23  Reason for Discharge:  Patient independent in their exercise.  Smoking History:  History  Smoking Status  . Former Smoker  . Packs/day: 1.00  . Years: 20.00  . Types: Cigarettes  . Quit date: 10/24/1970  Smokeless Tobacco  . Never Used    Diagnosis:  Shortness of breath  ADL [facility]SD:     Pulmonary Assessment Scores    Row Name 01/30/16 1605         ADL UCSD   ADL Phase Entry     SOB Score total 31        Initial Exercise Prescription:     Initial Exercise Prescription - 01/27/16 0900      Date of Initial Exercise RX and Referring Provider   Date 01/27/16   Referring Provider Dr. Einar Gip     Oxygen   Oxygen --  room air     Bike   Level 2   Minutes 15     NuStep   Level 2   Minutes 15   METs 1.5     Track   Laps 11   Minutes 15     Prescription Details   Frequency (times per week) 2   Duration Progress to 45 minutes of aerobic exercise without signs/symptoms of physical distress     Intensity   THRR 40-80% of Max Heartrate 56-111   Ratings of Perceived Exertion 11-13   Perceived Dyspnea 0-4     Progression   Progression Continue progressive overload as per policy without signs/symptoms or physical distress.     Resistance Training   Training Prescription Yes   Weight orange band   Reps 10-12      Discharge Exercise Prescription (Final Exercise Prescription Changes):     Exercise Prescription Changes - 05/12/16 1600      Response to Exercise   Blood Pressure (Admit) 120/72   Blood Pressure (Exercise) 106/64   Blood Pressure (Exit) 100/60   Heart Rate (Admit) 64 bpm   Heart Rate (Exercise) 107 bpm   Heart Rate (Exit) 82 bpm   Oxygen Saturation  (Admit) 97 %   Oxygen Saturation (Exercise) 97 %   Oxygen Saturation (Exit) 94 %   Rating of Perceived Exertion (Exercise) 11   Perceived Dyspnea (Exercise) 1   Duration Progress to 45 minutes of aerobic exercise without signs/symptoms of physical distress   Intensity THRR unchanged     Progression   Progression Continue to progress workloads to maintain intensity without signs/symptoms of physical distress.     Resistance Training   Training Prescription Yes   Weight orange bands   Reps 10-12  10 minutes of strength training     Interval Training   Interval Training No     Bike   Level 4   Minutes 17     NuStep   Level 6   Minutes 17   METs 3     Track   Laps 16   Minutes 17      Functional Capacity:     6 Minute Walk    Row Name 01/27/16 0928 05/15/16 0709       6 Minute Walk   Phase Initial Discharge  Distance 1200 feet 1448 feet    Walk Time 6 minutes 6 minutes    # of Rest Breaks 0 0    MPH 2.27 2.74    METS 1.89 3.07    RPE 12 13    Perceived Dyspnea  1 1    VO2 Peak 6.6  -    Symptoms No No    Resting HR 80 bpm 80 bpm    Resting BP 108/80 142/70    Max Ex. HR 101 bpm 109 bpm    Max Ex. BP 140/72 118/70      Interval HR   Baseline HR 80 80    1 Minute HR  - 92    2 Minute HR 99 92    3 Minute HR  - 101    4 Minute HR 100 96    5 Minute HR  - 106    6 Minute HR 99 109    2 Minute Post HR  - 90    Interval Heart Rate? Yes Yes      Interval Oxygen   Interval Oxygen?  - Yes    Baseline Oxygen Saturation % 99 % 94 %    Baseline Liters of Oxygen 0 L 0 L    1 Minute Oxygen Saturation % 99 % 92 %    1 Minute Liters of Oxygen 0 L 0 L    2 Minute Oxygen Saturation % 99 % 92 %    2 Minute Liters of Oxygen 0 L 0 L    3 Minute Oxygen Saturation % 99 % 89 %    3 Minute Liters of Oxygen 0 L 0 L    4 Minute Oxygen Saturation % 99 % 88 %    4 Minute Liters of Oxygen 0 L 0 L    5 Minute Oxygen Saturation % 98 % 100 %    5 Minute Liters of Oxygen  0 L 0 L    6 Minute Oxygen Saturation % 99 % 100 %    6 Minute Liters of Oxygen 0 L 0 L    2 Minute Post Oxygen Saturation % 98 % 99 %    2 Minute Post Liters of Oxygen 0 L 0 L       Psychological, QOL, Others - Outcomes: PHQ 2/9: Depression screen [facility] 2/9 05/14/2016 01/20/2016 05/03/2013  Decreased Interest 0 0 0  Down, Depressed, Hopeless 0 0 1  PHQ - 2 Score 0 0 1    Quality of Life:     Quality of Life - 01/30/16 1604      Quality of Life Scores   Health/Function Pre 26.14 %   Socioeconomic Pre 30 %   Psych/Spiritual Pre 25.14 %   Family Pre 25.38 %   GLOBAL Pre 26.67 %      Personal Goals: Goals established at orientation with interventions provided to work toward goal.     Personal Goals and Risk Factors at Admission - 01/20/16 1257      Core Components/Risk Factors/Patient Goals on Admission   Increase Strength and Stamina Yes   Intervention Provide advice, education, support and counseling about physical activity/exercise needs.;Develop an individualized exercise prescription for aerobic and resistive training based on initial evaluation findings, risk stratification, comorbidities and participant's personal goals.   Expected Outcomes Achievement of increased cardiorespiratory fitness and enhanced flexibility, muscular endurance and strength shown through measurements of functional capacity and personal statement of participant.   Improve shortness of breath with  ADL's Yes   Intervention Provide education, individualized exercise plan and daily activity instruction to help decrease symptoms of SOB with activities of daily living.   Expected Outcomes Short Term: Achieves a reduction of symptoms when performing activities of daily living.   Develop more efficient breathing techniques such as purse lipped breathing and diaphragmatic breathing; and practicing self-pacing with activity Yes   Intervention Provide education, demonstration and support about specific breathing  techniuqes utilized for more efficient breathing. Include techniques such as pursed lipped breathing, diaphragmatic breathing and self-pacing activity.   Expected Outcomes Short Term: Participant will be able to demonstrate and use breathing techniques as needed throughout daily activities.       Personal Goals Discharge:     Goals and Risk Factor Review    Row Name 01/20/16 1304 03/10/16 1125 04/13/16 1149 05/12/16 0818       Core Components/Risk Factors/Patient Goals Review   Personal Goals Review Increase Strength and Stamina;Improve shortness of breath with ADL's  - Increase Strength and Stamina;Improve shortness of breath with ADL's  -    Review increase workloads as tolerated by patient She is attending exercise classes regularly and is feeling stronger.  She is mid way through the program. Feeling much stronger, but at 82 y/o she has not seen the progress she had when she went through tthe program 3 years ago.  Tolerates exercise better on flat surfaces and in heated/ air conditioned environment. definite increase in strength and stamina, has lost 2 kg, workloads continue to be increased.  Has 2 exercise sessions left in program.    Expected Outcomes increased strength, stamina and less shortness of breath with exercise and ADL's. To continue to imcrease her strength and stamina as we increase her workloads She should graduate in the next 30 days, continue to increase workloads as tolerated. graduation in 2 sessions, should see a marked improvement since start of program.       Nutrition & Weight - Outcomes:     Pre Biometrics - 01/20/16 1255      Pre Biometrics   Grip Strength 18 kg       Nutrition:   Nutrition Discharge:     Nutrition Assessments - 05/20/16 1224      Rate Your Plate Scores   Pre Score 55   Post Score 55      Education Questionnaire Score:     Knowledge Questionnaire Score - 01/30/16 1604      Knowledge Questionnaire Score   Pre Score 9/13       Goals reviewed with patient; copy given to patient.

## 2016-06-11 NOTE — Addendum Note (Signed)
Encounter addended by: Lance Morin, RN on: 06/11/2016  9:59 AM<BR>    Actions taken: Episode resolved

## 2016-06-11 NOTE — Addendum Note (Signed)
Encounter addended by: Lance Morin, RN on: 06/11/2016  9:40 AM<BR>    Actions taken: Sign clinical note

## 2018-09-20 ENCOUNTER — Encounter (INDEPENDENT_AMBULATORY_CARE_PROVIDER_SITE_OTHER): Payer: Self-pay | Admitting: Orthopedic Surgery

## 2018-09-20 ENCOUNTER — Ambulatory Visit (INDEPENDENT_AMBULATORY_CARE_PROVIDER_SITE_OTHER): Payer: Medicare Other | Admitting: Physician Assistant

## 2018-09-20 ENCOUNTER — Ambulatory Visit (INDEPENDENT_AMBULATORY_CARE_PROVIDER_SITE_OTHER): Payer: Self-pay

## 2018-09-20 VITALS — Ht 62.0 in | Wt 167.0 lb

## 2018-09-20 DIAGNOSIS — M5442 Lumbago with sciatica, left side: Secondary | ICD-10-CM

## 2018-09-20 DIAGNOSIS — M5441 Lumbago with sciatica, right side: Secondary | ICD-10-CM | POA: Diagnosis not present

## 2018-09-20 DIAGNOSIS — G8929 Other chronic pain: Secondary | ICD-10-CM | POA: Diagnosis not present
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
## 2019-10-30 ENCOUNTER — Ambulatory Visit: Payer: Medicare Other | Attending: Internal Medicine

## 2020-01-22 ENCOUNTER — Encounter: Payer: Self-pay | Admitting: Cardiology

## 2020-01-22 ENCOUNTER — Other Ambulatory Visit: Payer: Self-pay

## 2020-01-22 ENCOUNTER — Ambulatory Visit: Payer: Medicare Other | Admitting: Cardiology

## 2020-01-22 VITALS — BP 114/72 | HR 79 | Temp 97.8°F | Resp 16 | Ht 63.0 in | Wt 178.0 lb

## 2020-01-22 DIAGNOSIS — Z87891 Personal history of nicotine dependence: Secondary | ICD-10-CM

## 2020-01-22 DIAGNOSIS — I129 Hypertensive chronic kidney disease with stage 1 through stage 4 chronic kidney disease, or unspecified chronic kidney disease: Secondary | ICD-10-CM

## 2020-01-22 DIAGNOSIS — R0609 Other forms of dyspnea: Secondary | ICD-10-CM

## 2020-01-22 DIAGNOSIS — E6609 Other obesity due to excess calories: Secondary | ICD-10-CM

## 2020-01-22 DIAGNOSIS — R011 Cardiac murmur, unspecified: Secondary | ICD-10-CM

## 2020-01-22 DIAGNOSIS — E782 Mixed hyperlipidemia: Secondary | ICD-10-CM

## 2020-01-22 DIAGNOSIS — R0989 Other specified symptoms and signs involving the circulatory and respiratory systems: Secondary | ICD-10-CM

## 2020-01-22 DIAGNOSIS — N183 Chronic kidney disease, stage 3 unspecified: Secondary | ICD-10-CM

## 2020-01-22 NOTE — Progress Notes (Signed)
REASON FOR CONSULT: Shortness of breath  Chief Complaint  Patient presents with  . New Patient (Initial Visit)    SOB    REQUESTING PHYSICIAN:  Berkley Harvey, NP [place],  [place] [id number]  Primary Cardiologist: Rex Kras, DO (established care 01/22/2020)  HPI  Krystal Hopkins is a 87 y.o. female who presents to the office with a chief complaint of " shortness of breath." Patient's past medical history and cardiac risk factors include:  Hypertension with chronic kidney disease stage III, obesity, hyperlipidemia, former smoker, postmenopausal female, advanced age.  Patient states that she recently went for her annual Medicare physical and noted that she was having effort related dyspnea at times with her primary care physician.  Patient states that on Easter weekend she went for a walk for short distance and noticed that she was having more difficulty in breathing with ambulation.  She did not have any chest pain with effort related activities.  Overall patient states that she is healthy but because she is 86 years old and new onset of shortness of breath with effort related activities she wanted to be evaluated from cardiology.  Denies prior history of coronary artery disease, myocardial infarction, congestive heart failure, deep venous thrombosis, pulmonary embolism, stroke, transient ischemic attack.  FUNCTIONAL STATUS: No structured exercise program or daily routine.    ALLERGIES: Allergies  Allergen Reactions  . Formaldehyde Swelling  . Fosamax [Alendronate Sodium] Other (See Comments)    "muscle aches"   MEDICATION LIST PRIOR TO VISIT: Current Outpatient Medications on File Prior to Visit  Medication Sig Dispense Refill  . acetaminophen (TYLENOL) 650 MG CR tablet Take 650 mg by mouth as needed.    Marland Kitchen aspirin EC 81 MG tablet Take 81 mg by mouth every morning.    . Calcium Carbonate (CALCIUM 600 PO) Take 1 tablet by mouth daily.    . calcium  carbonate (TUMS - DOSED IN MG ELEMENTAL CALCIUM) 500 MG chewable tablet Chew 1 tablet by mouth as needed for indigestion or heartburn.    . cetirizine (ZYRTEC) 10 MG tablet Take 10 mg by mouth as needed.     Marland Kitchen levothyroxine (SYNTHROID, LEVOTHROID) 100 MCG tablet Take 100 mcg by mouth every morning.    . Multiple Vitamin (MULITIVITAMIN WITH MINERALS) TABS Take 1 tablet by mouth every morning.    . Omega-3 Fatty Acids (FISH OIL PO) Take 1 tablet by mouth daily.    . pravastatin (PRAVACHOL) 20 MG tablet Take 20 mg by mouth daily.    . vitamin C (ASCORBIC ACID) 500 MG tablet Take 500 mg by mouth daily. Reported on 01/20/2016    . VITAMIN D, CHOLECALCIFEROL, PO Take 1 tablet by mouth daily.    . valsartan-hydrochlorothiazide (DIOVAN-HCT) 80-12.5 MG tablet Take 1 tablet by mouth daily.     No current facility-administered medications on file prior to visit.    PAST MEDICAL HISTORY: Past Medical History:  Diagnosis Date  . Blood transfusion without reported diagnosis   . Cataract   . Colon polyp 2014   TUBULAR ADENOMA.  . Hyperlipidemia   . Hypertension   . Hypothyroidism   . Osteoporosis     PAST SURGICAL HISTORY: Past Surgical History:  Procedure Laterality Date  . ABDOMINAL HYSTERECTOMY     uterus only removed  . APPENDECTOMY    . CATARACT EXTRACTION Bilateral   . COLONOSCOPY  2014  . TONSILLECTOMY AND ADENOIDECTOMY      FAMILY  HISTORY: The patient family history includes Heart disease in her father, mother, and another family member; Heart failure in her brother and sister.   SOCIAL HISTORY:  The patient  reports that she quit smoking about 49 years ago. Her smoking use included cigarettes. She has a 20.00 pack-year smoking history. She has never used smokeless tobacco. She reports current alcohol use. She reports that she does not use drugs.  REVIEW OF SYSTEMS: Review of Systems  Constitution: Negative for chills and fever.  HENT: Negative for ear discharge, ear pain and  nosebleeds.   Eyes: Negative for blurred vision and discharge.  Cardiovascular: Positive for dyspnea on exertion. Negative for chest pain, claudication, leg swelling, near-syncope, orthopnea, palpitations, paroxysmal nocturnal dyspnea and syncope.  Respiratory: Negative for cough and shortness of breath.   Endocrine: Negative for polydipsia, polyphagia and polyuria.  Hematologic/Lymphatic: Negative for bleeding problem.  Skin: Negative for flushing and nail changes.  Musculoskeletal: Negative for muscle cramps, muscle weakness and myalgias.  Gastrointestinal: Negative for abdominal pain, dysphagia, hematemesis, hematochezia, melena, nausea and vomiting.  Neurological: Positive for light-headedness. Negative for dizziness and focal weakness.   PHYSICAL EXAM: Vitals with BMI 01/22/2020 09/20/2018 05/12/2016  Height '5\' 3"'$  '5\' 2"'$  -  Weight 178 lbs 167 lbs 167 lbs 2 oz  BMI 17.51 02.58 -  Systolic 527 - -  Diastolic 72 - -  Pulse 79 - -    CONSTITUTIONAL: Well-developed and well-nourished. No acute distress.  SKIN: Skin is warm and dry. No rash noted. No cyanosis. No pallor. No jaundice HEAD: Normocephalic and atraumatic.  EYES: No scleral icterus MOUTH/THROAT: Moist oral membranes.  NECK: No JVD present. No thyromegaly noted.  Soft right carotid bruit. LYMPHATIC: No visible cervical adenopathy.  CHEST Normal respiratory effort. No intercostal retractions  LUNGS: Clear to auscultation bilaterally.  No stridor. No wheezes. No rales.  CARDIOVASCULAR: Regular, positive P8-E4, 3+ systolic ejection murmur heard at the second right intercostal space, no gallops or rubs appreciated  ABDOMINAL: Obese, soft, nontender, nondistended, positive bowel sounds in all 4 quadrants, no apparent ascites.  EXTREMITIES: No peripheral edema  HEMATOLOGIC: No significant bruising NEUROLOGIC: Oriented to person, place, and time. Nonfocal. Normal muscle tone.  PSYCHIATRIC: Normal mood and affect. Normal behavior.  Cooperative  CARDIAC DATABASE: EKG: 01/22/2020: Normal sinus, ventricular rate 75 bpm, left axis deviation, incomplete right bundle branch block, possible old anteroseptal infarct, no acute injury pattern.  Echocardiogram: None  Stress Testing: None  Heart Catheterization: None  LABORATORY DATA: TSH 3.410 03/27/2019   Lab Results  Component Value Date  CHOL 228 (H) 03/27/2019  TRIG 81 03/27/2019  HDL 92 03/27/2019  LDL 120 03/27/2019   External Labs: Collected: January 03, 2020 Creatinine 1.09 mg/dL. eGFR: 40 mL/min per 1.73 m   FINAL MEDICATION LIST END OF ENCOUNTER: No orders of the defined types were placed in this encounter.   Medications Discontinued During This Encounter  Medication Reason  . BENICAR HCT 20-12.5 MG per tablet Patient Preference  . vitamin E 100 UNIT capsule Patient Preference     Current Outpatient Medications:  .  acetaminophen (TYLENOL) 650 MG CR tablet, Take 650 mg by mouth as needed., Disp: , Rfl:  .  aspirin EC 81 MG tablet, Take 81 mg by mouth every morning., Disp: , Rfl:  .  Calcium Carbonate (CALCIUM 600 PO), Take 1 tablet by mouth daily., Disp: , Rfl:  .  calcium carbonate (TUMS - DOSED IN MG ELEMENTAL CALCIUM) 500 MG chewable tablet, Chew 1 tablet  by mouth as needed for indigestion or heartburn., Disp: , Rfl:  .  cetirizine (ZYRTEC) 10 MG tablet, Take 10 mg by mouth as needed. , Disp: , Rfl:  .  levothyroxine (SYNTHROID, LEVOTHROID) 100 MCG tablet, Take 100 mcg by mouth every morning., Disp: , Rfl:  .  Multiple Vitamin (MULITIVITAMIN WITH MINERALS) TABS, Take 1 tablet by mouth every morning., Disp: , Rfl:  .  Omega-3 Fatty Acids (FISH OIL PO), Take 1 tablet by mouth daily., Disp: , Rfl:  .  pravastatin (PRAVACHOL) 20 MG tablet, Take 20 mg by mouth daily., Disp: , Rfl:  .  vitamin C (ASCORBIC ACID) 500 MG tablet, Take 500 mg by mouth daily. Reported on 01/20/2016, Disp: , Rfl:  .  VITAMIN D, CHOLECALCIFEROL, PO, Take 1 tablet by mouth  daily., Disp: , Rfl:  .  valsartan-hydrochlorothiazide (DIOVAN-HCT) 80-12.5 MG tablet, Take 1 tablet by mouth daily., Disp: , Rfl:   IMPRESSION:    ICD-10-CM   1. Dyspnea on exertion  R06.00 EKG 12-Lead    PCV ECHOCARDIOGRAM COMPLETE    PCV MYOCARDIAL PERFUSION WITH LEXISCAN  2. Former smoker  Z87.891   3. Mixed hyperlipidemia  E78.2 Lipid Panel With LDL/HDL Ratio    LDL cholesterol, direct    Lipoprotein A (LPA)    Apo A1 + B + Ratio    PCV CAROTID DUPLEX (BILATERAL)  4. Benign hypertension with CKD (chronic kidney disease) stage III  I12.9 EKG 12-Lead   N18.30   5. Class 1 obesity due to excess calories without serious comorbidity with body mass index (BMI) of 31.0 to 31.9 in adult  E66.09    Z68.31   6. Murmur, cardiac  R01.1 PCV ECHOCARDIOGRAM COMPLETE  7. Bruit (arterial)  R09.89 PCV CAROTID DUPLEX (BILATERAL)     RECOMMENDATIONS: MIRIELLE BYRUM is a 86 y.o. female whose past medical history and cardiac risk factors include: Hypertension with chronic kidney disease stage III, obesity, hyperlipidemia, former smoker, postmenopausal female, advanced age.  Dyspnea on exertion:  Based on physical examination findings I believe that her effort related dyspnea is probably coming from her underlying valvular heart disease.  I appreciate a systolic ejection murmur consistent with a degree of aortic stenosis.  Echocardiogram will be ordered to evaluate for structural heart disease and left ventricular systolic function.  Given the patient's age, risk factors, EKG findings, recommended stress test to rule out reversible ischemia.  Lightheadedness:  I appreciated very faint carotid bruit cannot ascertain if it is secondary to delayed carotid upstrokes or carotid atherosclerosis.  Plan carotid duplex.  Mixed hyperlipidemia: . Continue statin therapy.   . Will check fasting lipid profile, to reevaluate therapy. . Currently managed by primary care provider. . Patient denies  myalgia or other side effects.  Former smoker: Educated on the importance of continued smoking cessation.  Obesity, due to excess calories: . Body mass index is 31.53 kg/m. . I reviewed with the patient the importance of diet, regular physical activity/exercise, weight loss.   . Patient is educated on increasing physical activity gradually as tolerated.  With the goal of moderate intensity exercise for 30 minutes a day 5 days a week.  Orders Placed This Encounter  Procedures  . Lipid Panel With LDL/HDL Ratio  . LDL cholesterol, direct  . Lipoprotein A (LPA)  . Apo A1 + B + Ratio  . PCV MYOCARDIAL PERFUSION WITH LEXISCAN  . EKG 12-Lead  . PCV ECHOCARDIOGRAM COMPLETE  . PCV CAROTID DUPLEX (BILATERAL)   --Continue  cardiac medications as reconciled in final medication list. --Return in about 6 weeks (around 03/04/2020) for Lipid follow-up., review test results.. Or sooner if needed. --Continue follow-up with your primary care physician regarding the management of your other chronic comorbid conditions.  Patient's questions and concerns were addressed to her satisfaction. She voices understanding of the instructions provided during this encounter.   During this visit I reviewed and updated: Tobacco history  allergies medication reconciliation  medical history  surgical history  family history  social history.  This note was created using a voice recognition software as a result there may be grammatical errors inadvertently enclosed that do not reflect the nature of this encounter. Every attempt is made to correct such errors.  Rex Kras, [place], [facility]  Pager: [phone] Office: [phone]

## 2020-01-22 NOTE — Patient Instructions (Signed)
Please have blood work done at the nearest [facility] when you are fasting.  Office will call you in regards to scheduling an echo, stress test, and carotid duplex.

## 2020-01-23 ENCOUNTER — Ambulatory Visit: Payer: Self-pay | Admitting: Cardiology

## 2020-01-26 ENCOUNTER — Other Ambulatory Visit: Payer: Self-pay

## 2020-01-26 ENCOUNTER — Ambulatory Visit: Payer: Medicare Other

## 2020-01-26 DIAGNOSIS — R0609 Other forms of dyspnea: Secondary | ICD-10-CM

## 2020-01-30 NOTE — Progress Notes (Signed)
Spoke with patient. Patient voiced understanding.

## 2020-02-06 ENCOUNTER — Ambulatory Visit: Payer: Medicare Other

## 2020-02-06 ENCOUNTER — Other Ambulatory Visit: Payer: Self-pay

## 2020-02-06 DIAGNOSIS — R0609 Other forms of dyspnea: Secondary | ICD-10-CM

## 2020-02-06 DIAGNOSIS — R011 Cardiac murmur, unspecified: Secondary | ICD-10-CM

## 2020-02-06 DIAGNOSIS — R0989 Other specified symptoms and signs involving the circulatory and respiratory systems: Secondary | ICD-10-CM

## 2020-02-06 DIAGNOSIS — E782 Mixed hyperlipidemia: Secondary | ICD-10-CM

## 2020-02-12 NOTE — Progress Notes (Signed)
Patient number not in service 

## 2020-02-13 ENCOUNTER — Telehealth: Payer: Self-pay

## 2020-02-13 NOTE — Telephone Encounter (Signed)
Error

## 2020-02-13 NOTE — Progress Notes (Signed)
Left voicemail to cb.

## 2020-02-13 NOTE — Progress Notes (Signed)
Spoke with patient. Patient relayed understanding.

## 2020-02-15 NOTE — Telephone Encounter (Signed)
Error

## 2020-03-06 ENCOUNTER — Other Ambulatory Visit: Payer: Self-pay

## 2020-03-06 ENCOUNTER — Ambulatory Visit: Payer: Medicare Other | Admitting: Cardiology

## 2020-03-06 ENCOUNTER — Telehealth: Payer: Self-pay

## 2020-03-06 ENCOUNTER — Encounter: Payer: Self-pay | Admitting: Cardiology

## 2020-03-06 VITALS — BP 116/55 | HR 62 | Ht 63.0 in | Wt 174.0 lb

## 2020-03-06 DIAGNOSIS — Z712 Person consulting for explanation of examination or test findings: Secondary | ICD-10-CM

## 2020-03-06 DIAGNOSIS — N183 Chronic kidney disease, stage 3 unspecified: Secondary | ICD-10-CM

## 2020-03-06 DIAGNOSIS — E782 Mixed hyperlipidemia: Secondary | ICD-10-CM

## 2020-03-06 DIAGNOSIS — Z87891 Personal history of nicotine dependence: Secondary | ICD-10-CM

## 2020-03-06 DIAGNOSIS — E6609 Other obesity due to excess calories: Secondary | ICD-10-CM

## 2020-03-06 DIAGNOSIS — R0609 Other forms of dyspnea: Secondary | ICD-10-CM

## 2020-03-06 DIAGNOSIS — Z6831 Body mass index (BMI) 31.0-31.9, adult: Secondary | ICD-10-CM

## 2020-03-06 NOTE — Telephone Encounter (Signed)
Pt seen in office today. Was told to cb to verify med. She is taking olmesartan 20mg 

## 2020-03-06 NOTE — Progress Notes (Signed)
Krystal Hopkins Date of Birth: 04/17/1932 MRN: [id number] Primary Care Provider:Jones, Sherrill Raring, NP Primary Cardiologist: Rex Kras, DO, [facility] (established care 01/22/2020)   Date: 03/06/20 Last Office Visit: 01/22/2020  Chief Complaint  Patient presents with  . Shortness of Breath    lab results   . Follow-up    HPI  Krystal Hopkins is a 86 y.o. female who presents to the office with a  chief complaint of " shortness of breath and review test results."  Her past medical history and cardiovascular risk factors are: Hypertension with chronic kidney disease stage III, obesity, hyperlipidemia, former smoker, postmenopausal female, advanced age.  Patient was originally referred to the office for evaluation of effort related dyspnea at the request of her primary care provider.   At the last office visit, patient noted that she went for a annual Medicare physical with her primary care provider and mention that she has been experiencing dyspnea on exertion during the Easter weekend.  Patient states that on Easter weekend she went for a walk for very short distance and started having difficulty with breathing.  She did not have any chest pain at rest or with effort related activities and symptoms subsided shortly thereafter.  She is unsure if she has underlying cardiac condition or is most likely secondary to deconditioning and age.  She was referred to cardiology for further evaluation.  Since last office visit her symptoms of shortness of breath with effort related activities have improved; however, patient states that she has been less active because she did not want symptoms to service again.  She was asked to undergo an echocardiogram and stress test echo noted preserved left ventricular systolic function and stress test note reversible ischemia.  Results were reviewed with the patient in great detail and findings noted below for further reference.  Carotid duplex results were also  reviewed with the patient at today's office visit and noted below for further reference.  Denies prior history of coronary artery disease, myocardial infarction, congestive heart failure, deep venous thrombosis, pulmonary embolism, stroke, transient ischemic attack.  FUNCTIONAL STATUS: No structured exercise program or daily routine.    ALLERGIES: Allergies  Allergen Reactions  . Formaldehyde Swelling  . Fosamax [Alendronate Sodium] Other (See Comments)    "muscle aches"   MEDICATION LIST PRIOR TO VISIT: Current Outpatient Medications on File Prior to Visit  Medication Sig Dispense Refill  . acetaminophen (TYLENOL) 650 MG CR tablet Take 650 mg by mouth as needed.    Marland Kitchen aspirin EC 81 MG tablet Take 81 mg by mouth every morning.    . Calcium Carbonate (CALCIUM 600 PO) Take 1 tablet by mouth daily.    . calcium carbonate (TUMS - DOSED IN MG ELEMENTAL CALCIUM) 500 MG chewable tablet Chew 1 tablet by mouth as needed for indigestion or heartburn.    . cetirizine (ZYRTEC) 10 MG tablet Take 10 mg by mouth as needed.     Marland Kitchen levothyroxine (SYNTHROID, LEVOTHROID) 100 MCG tablet Take 100 mcg by mouth every morning.    . Multiple Vitamin (MULITIVITAMIN WITH MINERALS) TABS Take 1 tablet by mouth every morning.    . olmesartan-hydrochlorothiazide (BENICAR HCT) 20-12.5 MG tablet Take 1 tablet by mouth daily.    . Omega-3 Fatty Acids (FISH OIL PO) Take 1 tablet by mouth daily.    . pravastatin (PRAVACHOL) 20 MG tablet Take 20 mg by mouth daily.    . vitamin C (ASCORBIC ACID) 500 MG tablet Take 500 mg by  mouth daily. Reported on 01/20/2016    . VITAMIN D, CHOLECALCIFEROL, PO Take 1 tablet by mouth daily.     No current facility-administered medications on file prior to visit.    PAST MEDICAL HISTORY: Past Medical History:  Diagnosis Date  . Blood transfusion without reported diagnosis   . Cataract   . Colon polyp 2014   TUBULAR ADENOMA.  . Hyperlipidemia   . Hypertension   . Hypothyroidism   .  Osteoporosis     PAST SURGICAL HISTORY: Past Surgical History:  Procedure Laterality Date  . ABDOMINAL HYSTERECTOMY     uterus only removed  . APPENDECTOMY    . CATARACT EXTRACTION Bilateral   . COLONOSCOPY  2014  . TONSILLECTOMY AND ADENOIDECTOMY      FAMILY HISTORY: The patient family history includes Heart disease in her father, mother, and another family member; Heart failure in her brother and sister.   SOCIAL HISTORY:  The patient  reports that she quit smoking about 49 years ago. Her smoking use included cigarettes. She has a 20.00 pack-year smoking history. She has never used smokeless tobacco. She reports current alcohol use. She reports that she does not use drugs.  REVIEW OF SYSTEMS: Review of Systems  Constitution: Negative for chills and fever.  HENT: Negative for ear discharge, ear pain and nosebleeds.   Eyes: Negative for blurred vision and discharge.  Cardiovascular: Negative for chest pain, claudication, dyspnea on exertion, leg swelling, near-syncope, orthopnea, palpitations, paroxysmal nocturnal dyspnea and syncope.  Respiratory: Negative for cough and shortness of breath.   Endocrine: Negative for polydipsia, polyphagia and polyuria.  Hematologic/Lymphatic: Negative for bleeding problem.  Skin: Negative for flushing and nail changes.  Musculoskeletal: Negative for muscle cramps, muscle weakness and myalgias.  Gastrointestinal: Negative for abdominal pain, dysphagia, hematemesis, hematochezia, melena, nausea and vomiting.  Neurological: Negative for dizziness, focal weakness and light-headedness.   PHYSICAL EXAM: Vitals with BMI 03/06/2020 01/22/2020 09/20/2018  Height 5' 3" 5' 3" 5' 2"  Weight 174 lbs 178 lbs 167 lbs  BMI 30.83 10.17 51.02  Systolic 585 277 -  Diastolic 55 72 -  Pulse 62 79 -    CONSTITUTIONAL: Well-developed and well-nourished. No acute distress.  SKIN: Skin is warm and dry. No rash noted. No cyanosis. No pallor. No jaundice HEAD:  Normocephalic and atraumatic.  EYES: No scleral icterus MOUTH/THROAT: Moist oral membranes.  NECK: No JVD present. No thyromegaly noted.  Soft right carotid bruit. LYMPHATIC: No visible cervical adenopathy.  CHEST Normal respiratory effort. No intercostal retractions  LUNGS: Clear to auscultation bilaterally.  No stridor. No wheezes. No rales.  CARDIOVASCULAR: Regular, positive O2-U2, systolic murmur heard at the left sternal border, no gallops or rubs appreciated  ABDOMINAL: Obese, soft, nontender, nondistended, positive bowel sounds in all 4 quadrants, no apparent ascites.  EXTREMITIES: No peripheral edema  HEMATOLOGIC: No significant bruising NEUROLOGIC: Oriented to person, place, and time. Nonfocal. Normal muscle tone.  PSYCHIATRIC: Normal mood and affect. Normal behavior. Cooperative  CARDIAC DATABASE: EKG: 01/22/2020: Normal sinus, ventricular rate 75 bpm, left axis deviation, incomplete right bundle branch block, possible old anteroseptal infarct, no acute injury pattern.  Echocardiogram: 02/06/2020: LVEF 35-36%, normal diastolic function, mild MR, mild to moderate TR, mild PR.  Stress Testing: Lexiscan Tetrofosmin stress test 01/26/2020: Normal myocardial perfusion imaging. Stress LVEF 75%.   Heart Catheterization: None  Carotid artery duplex: 02/06/2020: PSV in the right bifurcation, internal, external and common carotid arteries are within normal limits.  Minimal stenosis in the left internal  carotid artery (1-15%).  Antegrade right vertebral artery flow. Antegrade left vertebral artery flow.  LABORATORY DATA: TSH 3.410 03/27/2019   Lab Results  Component Value Date  CHOL 228 (H) 03/27/2019  TRIG 81 03/27/2019  HDL 92 03/27/2019  LDL 120 03/27/2019   External Labs: Collected: January 03, 2020 Creatinine 1.09 mg/dL. eGFR: 40 mL/min per 1.73 m   FINAL MEDICATION LIST END OF ENCOUNTER: No orders of the defined types were placed in this encounter.   Medications  Discontinued During This Encounter  Medication Reason  . valsartan-hydrochlorothiazide (DIOVAN-HCT) 80-12.5 MG tablet Change in therapy     Current Outpatient Medications:  .  acetaminophen (TYLENOL) 650 MG CR tablet, Take 650 mg by mouth as needed., Disp: , Rfl:  .  aspirin EC 81 MG tablet, Take 81 mg by mouth every morning., Disp: , Rfl:  .  Calcium Carbonate (CALCIUM 600 PO), Take 1 tablet by mouth daily., Disp: , Rfl:  .  calcium carbonate (TUMS - DOSED IN MG ELEMENTAL CALCIUM) 500 MG chewable tablet, Chew 1 tablet by mouth as needed for indigestion or heartburn., Disp: , Rfl:  .  cetirizine (ZYRTEC) 10 MG tablet, Take 10 mg by mouth as needed. , Disp: , Rfl:  .  levothyroxine (SYNTHROID, LEVOTHROID) 100 MCG tablet, Take 100 mcg by mouth every morning., Disp: , Rfl:  .  Multiple Vitamin (MULITIVITAMIN WITH MINERALS) TABS, Take 1 tablet by mouth every morning., Disp: , Rfl:  .  olmesartan-hydrochlorothiazide (BENICAR HCT) 20-12.5 MG tablet, Take 1 tablet by mouth daily., Disp: , Rfl:  .  Omega-3 Fatty Acids (FISH OIL PO), Take 1 tablet by mouth daily., Disp: , Rfl:  .  pravastatin (PRAVACHOL) 20 MG tablet, Take 20 mg by mouth daily., Disp: , Rfl:  .  vitamin C (ASCORBIC ACID) 500 MG tablet, Take 500 mg by mouth daily. Reported on 01/20/2016, Disp: , Rfl:  .  VITAMIN D, CHOLECALCIFEROL, PO, Take 1 tablet by mouth daily., Disp: , Rfl:   IMPRESSION:    ICD-10-CM   1. Encounter to discuss test results  Z71.2   2. Dyspnea on exertion  R06.00   3. Benign hypertension with CKD (chronic kidney disease) stage III  I12.9    N18.30   4. Class 1 obesity due to excess calories without serious comorbidity with body mass index (BMI) of 31.0 to 31.9 in adult  E66.09    Z68.31   5. Former smoker  Z87.891   55. Mixed hyperlipidemia  E78.2      RECOMMENDATIONS: MATTEA SEGER is a 86 y.o. female whose past medical history and cardiac risk factors include: Hypertension with chronic kidney disease  stage III, obesity, hyperlipidemia, former smoker, postmenopausal female, advanced age.  Dyspnea on exertion: Improving  At the last office visit patient was recommended to undergo an echocardiogram and nuclear stress test.  Results were reviewed with the patient in great detail at today's office visit.  She is encouraged to increase her physical activity as tolerated with a goal of 30 minutes a day 5 days a week.  Given her history of smoking patient is also recommended to follow-up with possible pulmonary evaluation.  She is asked to call the office sooner than her upcoming appointment if her symptoms of effort related dyspnea increase in intensity frequency and/or duration.  Mixed hyperlipidemia: . Continue statin therapy.   . Patient was recommended to have lipid profile checked prior to today's office visit.  However, patient forgot to get blood work done.  Marland Kitchen  Patient is asked to follow up with her primary care provider for further medication titration if needed. . Patient denies myalgia or other side effects.  Former smoker: Educated on the importance of continued smoking cessation.  Obesity, due to excess calories: Body mass index is 30.82 kg/m. . I reviewed with the patient the importance of diet, regular physical activity/exercise, weight loss.   . Patient is educated on increasing physical activity gradually as tolerated.  With the goal of moderate intensity exercise for 30 minutes a day 5 days a week.  No orders of the defined types were placed in this encounter.  --Continue cardiac medications as reconciled in final medication list. --Return in about 4 months (around 07/06/2020) for re-evaluation of symptoms.. Or sooner if needed. --Continue follow-up with your primary care physician regarding the management of your other chronic comorbid conditions.  Patient's questions and concerns were addressed to her satisfaction. She voices understanding of the instructions provided  during this encounter.   This note was created using a voice recognition software as a result there may be grammatical errors inadvertently enclosed that do not reflect the nature of this encounter. Every attempt is made to correct such errors.  Rex Kras, [place], [facility]  Pager: [phone] Office: [phone]

## 2020-03-07 NOTE — Telephone Encounter (Signed)
Okay; thanks.

## 2020-08-02 ENCOUNTER — Ambulatory Visit: Payer: Medicare Other | Admitting: Cardiology

## 2021-07-28 ENCOUNTER — Other Ambulatory Visit: Payer: Self-pay | Admitting: Orthopedic Surgery

## 2021-07-28 DIAGNOSIS — M5410 Radiculopathy, site unspecified: Secondary | ICD-10-CM

## 2021-07-28 DIAGNOSIS — M545 Low back pain, unspecified: Secondary | ICD-10-CM

## 2021-08-12 ENCOUNTER — Other Ambulatory Visit: Payer: Self-pay

## 2021-08-12 ENCOUNTER — Ambulatory Visit
Admission: RE | Admit: 2021-08-12 | Discharge: 2021-08-12 | Disposition: A | Discharge status: Home / Self Care | Payer: Medicare Other | Source: Ambulatory Visit | Attending: Orthopedic Surgery | Admitting: Orthopedic Surgery

## 2021-08-12 DIAGNOSIS — M5410 Radiculopathy, site unspecified: Secondary | ICD-10-CM

## 2021-08-12 DIAGNOSIS — M545 Low back pain, unspecified: Secondary | ICD-10-CM

## 2021-08-18 ENCOUNTER — Ambulatory Visit: Payer: Medicare Other | Admitting: Cardiology

## 2021-08-18 ENCOUNTER — Encounter: Payer: Self-pay | Admitting: Cardiology

## 2021-08-18 ENCOUNTER — Other Ambulatory Visit: Payer: Self-pay

## 2021-08-18 VITALS — BP 117/59 | HR 68 | Temp 97.0°F | Resp 16 | Ht 63.0 in | Wt 172.0 lb

## 2021-08-18 DIAGNOSIS — I10 Essential (primary) hypertension: Secondary | ICD-10-CM

## 2021-08-18 DIAGNOSIS — R6889 Other general symptoms and signs: Secondary | ICD-10-CM

## 2021-08-18 DIAGNOSIS — R0609 Other forms of dyspnea: Secondary | ICD-10-CM

## 2021-08-18 DIAGNOSIS — I452 Bifascicular block: Secondary | ICD-10-CM

## 2021-08-18 NOTE — Progress Notes (Signed)
Primary Physician/Referring:  Berkley Harvey, NP  Patient ID: Krystal Hopkins, female    DOB: 06-Mar-1932, 87 y.o.   MRN: [id number]  No chief complaint on file.  HPI:    Krystal Hopkins  is a 87 y.o. Caucasian female patient with hypertension, stage IIIa chronic kidney disease, hyperlipidemia, prior tobacco use disorder who had seen my partner Dr. Terri Skains in June 2021 last, wanted me to evaluate her dyspnea on exertion and wanted to establish with me.  On further questioning, main complaint is marked decrease in exercise tolerance.  She denies any palpitations or chest pain.  No PND or orthopnea or leg edema.  No significant change in her weight.  Past Medical History:  Diagnosis Date   Blood transfusion without reported diagnosis    Cataract    Colon polyp 2014   TUBULAR ADENOMA.   Hyperlipidemia    Hypertension    Hypothyroidism    Osteoporosis    Past Surgical History:  Procedure Laterality Date   ABDOMINAL HYSTERECTOMY     uterus only removed   APPENDECTOMY     CATARACT EXTRACTION Bilateral    COLONOSCOPY  2014   TONSILLECTOMY AND ADENOIDECTOMY     Family History  Problem Relation Age of Onset   Heart disease Mother    Heart disease Father    Heart failure Sister    Heart failure Sister    Stroke Brother    Heart disease Other        siblings   Colon cancer Neg Hx    Esophageal cancer Neg Hx    Rectal cancer Neg Hx    Stomach cancer Neg Hx     Social History   Tobacco Use   Smoking status: Former    Packs/day: 1.00    Years: 20.00    Pack years: 20.00    Types: Cigarettes    Quit date: 10/24/1970    Years since quitting: 50.8   Smokeless tobacco: Never  Substance Use Topics   Alcohol use: Yes    Comment: socially   Marital Status: Married  ROS  Review of Systems  Constitutional: Positive for malaise/fatigue.  Cardiovascular:  Positive for dyspnea on exertion. Negative for chest pain and leg swelling.  Gastrointestinal:  Negative for melena.   Objective  Blood pressure (!) 117/59, pulse 68, temperature (!) 97 F (36.1 C), temperature source Temporal, resp. rate 16, height _0  (1.6 m), weight 172 lb (78 kg), SpO2 98 %. Body mass index is 30.47 kg/m.  Vitals with BMI 08/18/2021 03/06/2020 01/22/2020  Height _1  _2  _3   Weight 172 lbs 174 lbs 178 lbs  BMI 30.48 93.79 02.40  Systolic 973 532 992  Diastolic 59 55 72  Pulse 68 62 79    Physical Exam Neck:     Vascular: No carotid bruit or JVD.  Cardiovascular:     Rate and Rhythm: Normal rate and regular rhythm.     Pulses: Normal pulses and intact distal pulses.     Heart sounds: Murmur heard.  Early systolic murmur is present with a grade of 2/6 at the upper right sternal border.    No gallop.  Pulmonary:     Effort: Pulmonary effort is normal.     Breath sounds: Normal breath sounds.  Abdominal:     General: Bowel sounds are normal.     Palpations: Abdomen is soft.  Musculoskeletal:        General: No swelling.  Laboratory examination:   External labs:   Labs 07/24/2021:  BUN 52, creatinine 1.29, EGFR 40 mL, potassium 5.0, CMP otherwise normal.  Hb 12.2/HCT 36.8, platelets 345.  Normal indicis.  TSH normal at 3.70.  Total cholesterol 183, triglycerides 73, HDL 80, LDL 88.  Medications and allergies   Allergies  Allergen Reactions   Formaldehyde Swelling   Fosamax [Alendronate Sodium] Other (See Comments)    "muscle aches"     Medication prior to this encounter:   Outpatient Medications Prior to Visit  Medication Sig Dispense Refill   acetaminophen (TYLENOL 8 HOUR ARTHRITIS PAIN) 650 MG CR tablet Take 650 mg by mouth every 8 (eight) hours as needed for pain.     alendronate (FOSAMAX) 70 MG tablet Take 70 mg by mouth once a week. Take with a full glass of water on an empty stomach.     aspirin EC 81 MG tablet Take 81 mg by mouth every morning.     calcium carbonate (TUMS - DOSED IN MG ELEMENTAL CALCIUM) 500 MG chewable tablet Chew 1 tablet  by mouth as needed for indigestion or heartburn.     Coenzyme Q10 (COQ-10) 100 MG capsule Take 100 mg by mouth daily.     levothyroxine (SYNTHROID, LEVOTHROID) 100 MCG tablet Take 100 mcg by mouth every morning.     Multiple Vitamin (MULITIVITAMIN WITH MINERALS) TABS Take 1 tablet by mouth every morning.     olmesartan (BENICAR) 20 MG tablet Take 20 mg by mouth daily.     Omega-3 Fatty Acids (FISH OIL PO) Take 1 tablet by mouth daily.     pravastatin (PRAVACHOL) 20 MG tablet Take 20 mg by mouth daily. Hold until see by me for 4 weeks starting today 08/18/21     vitamin C (ASCORBIC ACID) 500 MG tablet Take 500 mg by mouth daily. Reported on 01/20/2016     VITAMIN D, CHOLECALCIFEROL, PO Take 1 tablet by mouth daily.     vitamin E 200 UNIT capsule Take 200 Units by mouth daily.     acetaminophen (TYLENOL) 650 MG CR tablet Take 650 mg by mouth as needed.     Calcium Carbonate (CALCIUM 600 PO) Take 1 tablet by mouth daily.     cetirizine (ZYRTEC) 10 MG tablet Take 10 mg by mouth as needed.      No facility-administered medications prior to visit.    Medication list after today's encounter   Current Outpatient Medications  Medication Instructions   acetaminophen (TYLENOL 8 HOUR ARTHRITIS PAIN) 650 mg, Oral, Every 8 hours PRN   alendronate (FOSAMAX) 70 mg, Oral, Weekly, Take with a full glass of water on an empty stomach.   aspirin EC 81 mg, BH-each morning   calcium carbonate (TUMS - DOSED IN MG ELEMENTAL CALCIUM) 500 MG chewable tablet 1 tablet, Oral, As needed   CoQ-10 100 mg, Oral, Daily   levothyroxine (SYNTHROID) 100 mcg, BH-each morning   Multiple Vitamin (MULITIVITAMIN WITH MINERALS) TABS 1 tablet, BH-each morning   olmesartan (BENICAR) 20 mg, Oral, Daily   Omega-3 Fatty Acids (FISH OIL PO) 1 tablet, Daily   pravastatin (PRAVACHOL) 20 mg, Oral, Daily, Hold until see by me for 4 weeks starting today 08/18/21   vitamin C (ASCORBIC ACID) 500 mg, Oral, Daily, Reported on 01/20/2016    VITAMIN D, CHOLECALCIFEROL, PO 1 tablet, Daily   vitamin E (VITAMIN E) 200 Units, Oral, Daily   Radiology:   CT scan of the chest without contrast 12/30/2012: No evidence of  interstitial lung disease.  No findings to explain  the patient's cough and shortness of breath.   Cardiac Studies:  Cardiopulmonary stress test 01/26/2013: Exercise testing with gas exchange demonstrates a normal functional capacity when compared to matched sedentary  norms. There does not appear to be a clear ventilatory limitations. There appears to be mild circulatory limitation in  terms of diastolic dysfunction. IN addition her body habitus, deconditioning, and age-related decline may all be contributing to her exercise intolerance.   ABI 08/10/2013: Bilateral: ABI is within normal limits with abnormal Doppler  waveforms noted in the anterior tibial artery.    Carotid artery duplex  81/44/8185: Peak systolic velocities in the right bifurcation, internal, external and common carotid arteries are within normal limits. Minimal stenosis in the left internal carotid artery (1-15%). Antegrade right vertebral artery flow. Antegrade left vertebral artery flow  PCV ECHOCARDIOGRAM COMPLETE 02/06/2020  Narrative Echocardiogram 02/06/2020: Normal LV systolic function with visual EF 55-60%. Left ventricle cavity is normal in size. Normal global wall motion. Normal diastolic filling pattern, normal LAP. Calculated EF 55%. Mild (Grade I) mitral regurgitation. Mild to moderate tricuspid regurgitation. Mild pulmonic regurgitation. No prior study for comparison.     PCV MYOCARDIAL PERFUSION WITH LEXISCAN 01/26/2020  Narrative Lexiscan Tetrofosmin stress test 01/26/2020: No previous exam available for comparison. Lexiscan nuclear stress test performed using 1-day protocol. Stress EKG is non-diagnostic, as this is pharmacological stress test. In addition, stress EKG at 84% MPHR showed sinus tachycardia, RBBB, LAFB, no  ischemic changes. Normal myocardial perfusion imaging. Stress LVEF 75%. Low risk study.   Six Minute Walk - 08/18/21 1400       Six Minute Walk   Medications taken before test (dose and time) none    Lap distance in meters  20 meters    Laps Completed  16    Partial lap (in meters) 0 meters    Baseline Heartrate 81    Baseline SPO2 96 %      Interval Oxygen Saturation and HR    2 Minute Oxygen Saturation % 98 %    2 Minute HR 112    4 Minute Oxygen Saturation % 100 %    4 Minute HR 120    6 Minute Oxygen Saturation % 100 %    6 Minute HR 116      End of Test Values   Heartrate 113    SPO2 100 %      2 Minutes Post Walk Values   BP (sitting) 111/58    Heartrate 78    SPO2 98 %    Stopped or paused before six minutes? No      Interpretation   Distance completed 320 meters    Tech Comments: Patient completed 6 minute walk test without stops/complaints at fastest speed capable.            EKG:   EKG 08/18/2021: Sinus rhythm with first-degree AV block, left atrial enlargement, left axis deviation, left anterior fascicular block.  Incomplete right bundle branch block.  No evidence of ischemia, normal QT interval.  No significant change from prior EKG 01/22/2020  Assessment     ICD-10-CM   1. Dyspnea on exertion  R06.09 6 minute walk    Brain natriuretic peptide    PCV ECHOCARDIOGRAM COMPLETE    Pulse oximetry, overnight    2. Decreased exercise tolerance  R68.89     3. Bifascicular block  I45.2     4. Primary hypertension  I10 EKG 12-Lead  Medications Discontinued During This Encounter  Medication Reason   Calcium Carbonate (CALCIUM 600 PO) Error   cetirizine (ZYRTEC) 10 MG tablet Error   acetaminophen (TYLENOL) 650 MG CR tablet Error    No orders of the defined types were placed in this encounter.  Orders Placed This Encounter  Procedures   Brain natriuretic peptide   Pulse oximetry, overnight    [employer]   EKG 12-Lead   PCV ECHOCARDIOGRAM  COMPLETE    Standing Status:   Future    Standing Expiration Date:   08/18/2022   6 minute walk    Standing Status:   Future    Standing Expiration Date:   09/17/2021    Recommendations:   LITZY DICKER is a 87 y.o. Caucasian female patient with hypertension, stage IIIa chronic kidney disease, hyperlipidemia, prior tobacco use disorder who had seen my partner Dr. Terri Skains in June 2021 last, wanted me to evaluate her dyspnea on exertion and wanted to establish with me.  Although 87 years of age, lives independently with her daughter, drove here to the office.  Her main complaint is exercise intolerance.  I performed a 6-minute walk test, patient achieved a maximum heart rate of 84% MPHR with no desaturation.  Extremely difficult to evaluate her symptoms, I will set her up for a routine treadmill exercise stress test to evaluate for heart rate response and also perform nocturnal oximetry for dyspnea.  We will obtain a BNP and I will also repeat an echocardiogram and see her back after this.  I will also give her a statin holiday, advised her to discontinue pravastatin for now and hold it until seen by me in 4 weeks.    Adrian Prows, MD, [facility] 08/18/2021, 2:22 PM Office: [phone]

## 2021-08-18 NOTE — Progress Notes (Signed)
EKG 08/18/2021: Sinus rhythm with first-degree AV block, left atrial enlargement, left axis deviation, left anterior fascicular block.  Incomplete right bundle branch block.  No evidence of ischemia, normal QT interval.  No significant change from prior EKG 01/22/2020

## 2021-08-19 ENCOUNTER — Ambulatory Visit: Payer: Medicare Other | Admitting: Cardiology

## 2021-08-19 LAB — BRAIN NATRIURETIC PEPTIDE: BNP: 21.8 pg/mL (ref 0.0–100.0)

## 2021-08-19 NOTE — Progress Notes (Signed)
BNP is normal and does not suggest heart failure as etiology for her dyspnea.  We will continue to follow.

## 2021-08-21 ENCOUNTER — Other Ambulatory Visit: Payer: Self-pay

## 2021-08-21 ENCOUNTER — Ambulatory Visit: Payer: Medicare Other

## 2021-08-21 DIAGNOSIS — R0609 Other forms of dyspnea: Secondary | ICD-10-CM

## 2021-08-24 NOTE — Progress Notes (Signed)
Severe TR and may explain her dyspnea. Will discuss on OV

## 2021-09-17 ENCOUNTER — Encounter: Payer: Self-pay | Admitting: Cardiology

## 2021-09-17 ENCOUNTER — Other Ambulatory Visit: Payer: Self-pay

## 2021-09-17 ENCOUNTER — Ambulatory Visit: Payer: Medicare Other | Admitting: Cardiology

## 2021-09-17 VITALS — BP 120/68 | HR 76 | Resp 16 | Ht 63.0 in | Wt 168.0 lb

## 2021-09-17 DIAGNOSIS — I452 Bifascicular block: Secondary | ICD-10-CM

## 2021-09-17 DIAGNOSIS — T466X5A Adverse effect of antihyperlipidemic and antiarteriosclerotic drugs, initial encounter: Secondary | ICD-10-CM

## 2021-09-17 DIAGNOSIS — N1832 Chronic kidney disease, stage 3b: Secondary | ICD-10-CM | POA: Insufficient documentation

## 2021-09-17 DIAGNOSIS — R0609 Other forms of dyspnea: Secondary | ICD-10-CM

## 2021-09-17 DIAGNOSIS — I10 Essential (primary) hypertension: Secondary | ICD-10-CM

## 2021-09-17 DIAGNOSIS — G720 Drug-induced myopathy: Secondary | ICD-10-CM

## 2021-09-17 NOTE — Progress Notes (Signed)
Primary Physician/Referring:  Berkley Harvey, NP  Patient ID: Krystal Hopkins, female    DOB: [birth year], 87 y.o.   MRN: [id number]  Chief Complaint  Patient presents with   Shortness of Breath   Follow-up    HPI:    Krystal Hopkins  is a 87 y.o. Caucasian female patient with hypertension, stage IIIa chronic kidney disease, hyperlipidemia, prior tobacco use disorder who had seen 4 weeks ago for fatigue, generalized weakness, decreased exercise tolerance and dyspnea on exertion.  I had suspected statin induced myalgias and myopathy, after discontinuing pravastatin, she has felt remarkably well.  She has not had any further episodes of myalgias, leg cramps, she is sleeping well and feels rested in the morning and fatigue is improved.  .  Past Medical History:  Diagnosis Date   Blood transfusion without reported diagnosis    Cataract    Colon polyp 2014   TUBULAR ADENOMA.   Hyperlipidemia    Hypertension    Hypothyroidism    Osteoporosis    Past Surgical History:  Procedure Laterality Date   ABDOMINAL HYSTERECTOMY     uterus only removed   APPENDECTOMY     CATARACT EXTRACTION Bilateral    COLONOSCOPY  2014   TONSILLECTOMY AND ADENOIDECTOMY     Family History  Problem Relation Age of Onset   Heart disease Mother    Heart disease Father    Heart failure Sister    Heart failure Sister    Stroke Brother    Heart disease Other        siblings   Colon cancer Neg Hx    Esophageal cancer Neg Hx    Rectal cancer Neg Hx    Stomach cancer Neg Hx     Social History   Tobacco Use   Smoking status: Former    Packs/day: 1.00    Years: 20.00    Pack years: 20.00    Types: Cigarettes    Quit date: 10/24/1970    Years since quitting: 50.9   Smokeless tobacco: Never  Substance Use Topics   Alcohol use: Yes    Comment: socially   Marital Status: Married  ROS  Review of Systems  Constitutional: Negative for malaise/fatigue.  Cardiovascular:  Negative for chest pain,  dyspnea on exertion and leg swelling.  Gastrointestinal:  Negative for melena.  Objective  Blood pressure 120/68, pulse 76, resp. rate 16, height _0  (1.6 m), weight 168 lb (76.2 kg), SpO2 96 %. Body mass index is 29.76 kg/m.  Vitals with BMI 09/17/2021 08/18/2021 03/06/2020  Height _1  _2  _3   Weight 168 lbs 172 lbs 174 lbs  BMI 29.77 89.37 34.28  Systolic 768 115 726  Diastolic 68 59 55  Pulse 76 68 62    Physical Exam Neck:     Vascular: No carotid bruit or JVD.  Cardiovascular:     Rate and Rhythm: Normal rate and regular rhythm.     Pulses: Normal pulses and intact distal pulses.     Heart sounds: Murmur heard.  Early systolic murmur is present with a grade of 2/6 at the upper right sternal border.    No gallop.  Pulmonary:     Effort: Pulmonary effort is normal.     Breath sounds: Normal breath sounds.  Abdominal:     General: Bowel sounds are normal.     Palpations: Abdomen is soft.  Musculoskeletal:        General: No swelling.  Laboratory examination:   External labs:   Labs 07/24/2021:  BUN 52, creatinine 1.29, EGFR 40 mL, potassium 5.0, CMP otherwise normal.  Hb 12.2/HCT 36.8, platelets 345.  Normal indicis.  TSH normal at 3.70.  Total cholesterol 183, triglycerides 73, HDL 80, LDL 88.  Medications and allergies   Allergies  Allergen Reactions   Formaldehyde Swelling   Fosamax [Alendronate Sodium] Other (See Comments)    "muscle aches"     Medication prior to this encounter:   Outpatient Medications Prior to Visit  Medication Sig Dispense Refill   acetaminophen (TYLENOL) 650 MG CR tablet Take 650 mg by mouth every 8 (eight) hours as needed for pain.     alendronate (FOSAMAX) 70 MG tablet Take 70 mg by mouth once a week. Take with a full glass of water on an empty stomach.     aspirin EC 81 MG tablet Take 81 mg by mouth every morning.     calcium carbonate (TUMS - DOSED IN MG ELEMENTAL CALCIUM) 500 MG chewable tablet Chew 1 tablet by  mouth as needed for indigestion or heartburn.     Coenzyme Q10 (COQ-10) 100 MG capsule Take 100 mg by mouth daily.     levothyroxine (SYNTHROID, LEVOTHROID) 100 MCG tablet Take 100 mcg by mouth every morning.     Multiple Vitamin (MULITIVITAMIN WITH MINERALS) TABS Take 1 tablet by mouth every morning.     olmesartan (BENICAR) 20 MG tablet Take 20 mg by mouth daily.     Omega-3 Fatty Acids (FISH OIL PO) Take 1 tablet by mouth daily.     vitamin C (ASCORBIC ACID) 500 MG tablet Take 500 mg by mouth daily. Reported on 01/20/2016     VITAMIN D, CHOLECALCIFEROL, PO Take 1 tablet by mouth daily.     vitamin E 200 UNIT capsule Take 200 Units by mouth daily.     pravastatin (PRAVACHOL) 20 MG tablet Take 20 mg by mouth daily. Hold until see by me for 4 weeks starting today 08/18/21 (Patient not taking: Reported on 09/17/2021)     No facility-administered medications prior to visit.    Medication list after today's encounter   Current Outpatient Medications  Medication Instructions   acetaminophen (TYLENOL) 650 mg, Oral, Every 8 hours PRN   alendronate (FOSAMAX) 70 mg, Oral, Weekly, Take with a full glass of water on an empty stomach.   aspirin EC 81 mg, BH-each morning   calcium carbonate (TUMS - DOSED IN MG ELEMENTAL CALCIUM) 500 MG chewable tablet 1 tablet, Oral, As needed   CoQ-10 100 mg, Oral, Daily   levothyroxine (SYNTHROID) 100 mcg, BH-each morning   Multiple Vitamin (MULITIVITAMIN WITH MINERALS) TABS 1 tablet, BH-each morning   olmesartan (BENICAR) 20 mg, Oral, Daily   Omega-3 Fatty Acids (FISH OIL PO) 1 tablet, Daily   pravastatin (PRAVACHOL) 20 mg, Daily   vitamin C (ASCORBIC ACID) 500 mg, Oral, Daily, Reported on 01/20/2016   VITAMIN D, CHOLECALCIFEROL, PO 1 tablet, Daily   vitamin E 200 Units, Oral, Daily   Radiology:   CT scan of the chest without contrast 12/30/2012: No evidence of interstitial lung disease.  No findings to explain  the patient's cough and shortness of breath.    Cardiac Studies:  Cardiopulmonary stress test 01/26/2013: Exercise testing with gas exchange demonstrates a normal functional capacity when compared to matched sedentary  norms. There does not appear to be a clear ventilatory limitations. There appears to be mild circulatory limitation in  terms of diastolic dysfunction.  IN addition her body habitus, deconditioning, and age-related decline may all be contributing to her exercise intolerance.   ABI 08/10/2013: Bilateral: ABI is within normal limits with abnormal Doppler  waveforms noted in the anterior tibial artery.    Carotid artery duplex  69/48/5462: Peak systolic velocities in the right bifurcation, internal, external and common carotid arteries are within normal limits. Minimal stenosis in the left internal carotid artery (1-15%). Antegrade right vertebral artery flow. Antegrade left vertebral artery flow  PCV MYOCARDIAL PERFUSION WITH LEXISCAN 01/26/2020  Narrative Lexiscan Tetrofosmin stress test 01/26/2020: No previous exam available for comparison. Lexiscan nuclear stress test performed using 1-day protocol. Stress EKG is non-diagnostic, as this is pharmacological stress test. In addition, stress EKG at 84% MPHR showed sinus tachycardia, RBBB, LAFB, no ischemic changes. Normal myocardial perfusion imaging. Stress LVEF 75%. Low risk study.  PCV ECHOCARDIOGRAM COMPLETE 08/21/2021  Narrative Echocardiogram 08/21/2021: Left ventricle cavity is normal in size. Focal basal septal hypertrophy. Normal LV systolic function with EF 59%. Normal global wall motion. Doppler evidence of grade I (impaired) diastolic dysfunction, normal LAP. Severe tricuspid regurgitation. Peak RA-RV gradient 13 mmHg. IVC not seen. Previous study in 02/2020 reported mild to moderate TR.    EKG:   EKG 08/18/2021: Sinus rhythm with first-degree AV block, left atrial enlargement, left axis deviation, left anterior fascicular block.  Incomplete right bundle  branch block.  No evidence of ischemia, normal QT interval.  No significant change from prior EKG 01/22/2020  Assessment     ICD-10-CM   1. Dyspnea on exertion  R06.09     2. Bifascicular block  I45.2     3. Primary hypertension  I10     4. Stage 3b chronic kidney disease (HCC)  N18.32     5. Statin myopathy Pravastatin 09/15/2021  G72.0    T46.6X5A        There are no discontinued medications.   No orders of the defined types were placed in this encounter.  No orders of the defined types were placed in this encounter.   Recommendations:   Krystal Hopkins is a 87 y.o. Caucasian female patient with hypertension, stage IIIa chronic kidney disease, hyperlipidemia, prior tobacco use disorder who had seen 4 weeks ago for fatigue, generalized weakness, decreased exercise tolerance and dyspnea on exertion.  I had suspected statin induced myalgias and myopathy, after discontinuing pravastatin, she has felt remarkably well.  She has not had any further episodes of myalgias, leg cramps, she is sleeping well and feels rested in the morning and fatigue is improved.  She in fact drove herself to the office today.  She has bifascicular block however I had performed a 6-minute walk test on her last office visit and she was able to achieve 84% of MPHR with activity and there was no desaturation.  Overall she is feeling well, blood pressures well controlled, given her advanced age, I do not think she needs to be on a statin and patient is very reluctant to going back on statins since she feels so well.  I will see her back in 6 months for continued follow-up of hypertension and bifascicular block.    Adrian Prows, MD, [facility] 09/17/2021, 1:46 PM Office: [phone]

## 2022-03-23 ENCOUNTER — Ambulatory Visit: Payer: Medicare Other | Admitting: Cardiology

## 2022-03-23 ENCOUNTER — Encounter: Payer: Self-pay | Admitting: Cardiology

## 2022-03-23 VITALS — BP 112/60 | HR 70 | Temp 98.5°F | Resp 17 | Ht 63.0 in | Wt 165.2 lb

## 2022-03-23 DIAGNOSIS — I10 Essential (primary) hypertension: Secondary | ICD-10-CM

## 2022-03-23 DIAGNOSIS — G720 Drug-induced myopathy: Secondary | ICD-10-CM

## 2022-03-23 DIAGNOSIS — I452 Bifascicular block: Secondary | ICD-10-CM

## 2022-03-23 DIAGNOSIS — E7800 Pure hypercholesterolemia, unspecified: Secondary | ICD-10-CM

## 2022-03-23 MED ORDER — EZETIMIBE 10 MG PO TABS: 10.0000 mg | ORAL_TABLET | Freq: Every day | ORAL | 3 refills | Status: DC

## 2022-03-23 NOTE — Progress Notes (Signed)
Primary Physician/Referring:  Berkley Harvey, NP  Patient ID: Krystal Hopkins, female    DOB: [birth year], 88 y.o.   MRN: [id number]  Chief Complaint  Patient presents with   Hypertension   Bifascicular block    6 month    HPI:    Krystal Hopkins  is a 88 y.o. Caucasian female patient with hypertension, stage IIIa chronic kidney disease, hyperlipidemia, prior tobacco use disorder. Patient has known history of bifascicular block, however office visit she underwent 6-minute walk test and was able to achieve 84% of MPHR with activity and there was no desaturation.  Patient was last seen in the office 09/17/2021 by Dr. Einar Gip at which time patient's symptoms of fatigue and decreased exercise tolerance had significantly improved since discontinuing pravastatin.  Patient was advised not to resume statin therapy and was overall stable from a cardiovascular standpoint.  Patient now presents for 86-month follow-up of hypertension and bifascicular block.  Patient reports mild fatigue and mild dyspnea on exertion, which are stable compared to last office visit.  He is still able to do activities of daily living as well as light housework and grocery shopping without issue.  Denies chest pain, orthopnea, PND, leg edema.  Past Medical History:  Diagnosis Date   Blood transfusion without reported diagnosis    Cataract    Colon polyp 2014   TUBULAR ADENOMA.   Hyperlipidemia    Hypertension    Hypothyroidism    Osteoporosis    Past Surgical History:  Procedure Laterality Date   ABDOMINAL HYSTERECTOMY     uterus only removed   APPENDECTOMY     CATARACT EXTRACTION Bilateral    COLONOSCOPY  2014   TONSILLECTOMY AND ADENOIDECTOMY     Family History  Problem Relation Age of Onset   Heart attack Mother 58   Heart disease Mother    Heart attack Father 27   Heart disease Father    Heart failure Sister 67   Heart failure Sister 54   Congestive Heart Failure Sister    Heart attack Brother 15    Stroke Brother    Heart disease Other        siblings   Colon cancer Neg Hx    Esophageal cancer Neg Hx    Rectal cancer Neg Hx    Stomach cancer Neg Hx     Social History   Tobacco Use   Smoking status: Former    Packs/day: 1.00    Years: 20.00    Total pack years: 20.00    Types: Cigarettes    Quit date: 10/24/1970    Years since quitting: 51.4   Smokeless tobacco: Never  Substance Use Topics   Alcohol use: Yes    Comment: socially   Marital Status: Married  ROS  Review of Systems  Constitutional: Positive for malaise/fatigue (mild).  Cardiovascular:  Positive for dyspnea on exertion (mild). Negative for chest pain and leg swelling.  Gastrointestinal:  Negative for melena.   Objective  Blood pressure 112/60, pulse 70, temperature 98.5 F (36.9 C), temperature source Temporal, resp. rate 17, height $RemoveBe'5\' 3"'hNMBLExvT$  (1.6 m), weight 165 lb 3.2 oz (74.9 kg), SpO2 98 %. Body mass index is 29.26 kg/m.     03/23/2022   11:19 AM 09/17/2021    1:14 PM 08/18/2021    1:23 PM  Vitals with BMI  Height $Remov'5\' 3"'PQGajg$  $Remove'5\' 3"'gGRGKkI$  $RemoveB'5\' 3"'WhXpBwOZ$   Weight 165 lbs 3 oz 168 lbs 172 lbs  BMI 29.27 29.77 30.48  Systolic 578 469 629  Diastolic 60 68 59  Pulse 70 76 68    Physical Exam Vitals reviewed.  Neck:     Vascular: No carotid bruit or JVD.  Cardiovascular:     Rate and Rhythm: Normal rate and regular rhythm.     Pulses: Normal pulses and intact distal pulses.     Heart sounds: Murmur heard.     Early systolic murmur is present with a grade of 2/6 at the upper right sternal border.     No gallop.  Pulmonary:     Effort: Pulmonary effort is normal.     Breath sounds: Normal breath sounds.  Musculoskeletal:     Right lower leg: No edema.     Left lower leg: No edema.     Laboratory examination:   External labs:  01/23/2022: Sodium 136, potassium 4.5, BUN 49, creatinine 1.24, AST 23, ALT 16, GFR 42 Total cholesterol 273, triglycerides 84, HDL 88, LDL 168 TSH 5.59 Hgb 12.6, HCT 38.7, MCV 92.7,  platelet 394  Labs 07/24/2021:  BUN 52, creatinine 1.29, EGFR 40 mL, potassium 5.0, CMP otherwise normal.  Hb 12.2/HCT 36.8, platelets 345.  Normal indicis.  TSH normal at 3.70.  Total cholesterol 183, triglycerides 73, HDL 80, LDL 88.  Allergies   Allergies  Allergen Reactions   Formaldehyde Swelling   Fosamax [Alendronate Sodium] Other (See Comments)    "muscle aches"    Medication prior to this encounter:   Outpatient Medications Prior to Visit  Medication Sig Dispense Refill   acetaminophen (TYLENOL) 650 MG CR tablet Take 650 mg by mouth every 8 (eight) hours as needed for pain.     alendronate (FOSAMAX) 70 MG tablet Take 70 mg by mouth once a week. Take with a full glass of water on an empty stomach.     aspirin EC 81 MG tablet Take 81 mg by mouth every morning.     Coenzyme Q10 (COQ-10) 100 MG capsule Take 100 mg by mouth daily.     diclofenac (VOLTAREN) 0.1 % ophthalmic solution 4 (four) times daily.     levothyroxine (SYNTHROID, LEVOTHROID) 100 MCG tablet Take 100 mcg by mouth every morning.     Multiple Vitamin (MULITIVITAMIN WITH MINERALS) TABS Take 1 tablet by mouth every morning.     olmesartan (BENICAR) 20 MG tablet Take 20 mg by mouth daily.     Omega-3 Fatty Acids (FISH OIL PO) Take 1 tablet by mouth daily.     vitamin C (ASCORBIC ACID) 500 MG tablet Take 500 mg by mouth daily.     VITAMIN D, CHOLECALCIFEROL, PO Take 1 tablet by mouth daily.     calcium carbonate (TUMS - DOSED IN MG ELEMENTAL CALCIUM) 500 MG chewable tablet Chew 1 tablet by mouth as needed for indigestion or heartburn.     vitamin C (ASCORBIC ACID) 500 MG tablet Take 500 mg by mouth daily. Reported on 01/20/2016     vitamin E 200 UNIT capsule Take 200 Units by mouth daily.     pravastatin (PRAVACHOL) 20 MG tablet Take 20 mg by mouth daily. Hold until see by me for 4 weeks starting today 08/18/21 (Patient not taking: Reported on 09/17/2021)     No facility-administered medications prior to visit.     Medication list after today's encounter   Current Outpatient Medications  Medication Instructions   acetaminophen (TYLENOL) 650 mg, Oral, Every 8 hours PRN   alendronate (FOSAMAX) 70 mg, Oral, Weekly, Take with a full glass  of water on an empty stomach.   aspirin EC 81 mg, BH-each morning   calcium carbonate (TUMS - DOSED IN MG ELEMENTAL CALCIUM) 500 MG chewable tablet 1 tablet, Oral, As needed   CoQ-10 100 mg, Oral, Daily   diclofenac (VOLTAREN) 0.1 % ophthalmic solution 4 times daily   ezetimibe (ZETIA) 10 mg, Oral, Daily   levothyroxine (SYNTHROID) 100 mcg, BH-each morning   Multiple Vitamin (MULITIVITAMIN WITH MINERALS) TABS 1 tablet, BH-each morning   olmesartan (BENICAR) 20 mg, Oral, Daily   Omega-3 Fatty Acids (FISH OIL PO) 1 tablet, Daily   vitamin C (ASCORBIC ACID) 500 mg, Oral, Daily, Reported on 01/20/2016   vitamin C (ASCORBIC ACID) 500 mg, Oral, Daily   VITAMIN D, CHOLECALCIFEROL, PO 1 tablet, Daily   vitamin E 200 Units, Oral, Daily   Radiology:   CT scan of the chest without contrast 12/30/2012: No evidence of interstitial lung disease.  No findings to explain  the patient's cough and shortness of breath.   Cardiac Studies:  Cardiopulmonary stress test 01/26/2013: Exercise testing with gas exchange demonstrates a normal functional capacity when compared to matched sedentary  norms. There does not appear to be a clear ventilatory limitations. There appears to be mild circulatory limitation in  terms of diastolic dysfunction. IN addition her body habitus, deconditioning, and age-related decline may all be contributing to her exercise intolerance.   ABI 08/10/2013: Bilateral: ABI is within normal limits with abnormal Doppler  waveforms noted in the anterior tibial artery.    Carotid artery duplex  02/06/2020: Peak systolic velocities in the right bifurcation, internal, external and common carotid arteries are within normal limits. Minimal stenosis in the left  internal carotid artery (1-15%). Antegrade right vertebral artery flow. Antegrade left vertebral artery flow  PCV MYOCARDIAL PERFUSION WITH LEXISCAN 01/26/2020 No previous exam available for comparison. Lexiscan nuclear stress test performed using 1-day protocol. Stress EKG is non-diagnostic, as this is pharmacological stress test. In addition, stress EKG at 84% MPHR showed sinus tachycardia, RBBB, LAFB, no ischemic changes. Normal myocardial perfusion imaging. Stress LVEF 75%. Low risk study.  PCV ECHOCARDIOGRAM COMPLETE 08/21/2021 Left ventricle cavity is normal in size. Focal basal septal hypertrophy. Normal LV systolic function with EF 59%. Normal global wall motion. Doppler evidence of grade I (impaired) diastolic dysfunction, normal LAP. Severe tricuspid regurgitation. Peak RA-RV gradient 13 mmHg. IVC not seen. Previous study in 02/2020 reported mild to moderate TR.   EKG:  03/23/2022: Sinus rhythm with first-degree AV block at a rate of 62 bpm.  Left axis, left anterior fascicular block.  Incomplete right bundle branch block.  Bifascicular block.  No evidence of ischemia or underlying injury pattern.  Compared to EKG 08/18/2021, no significant change.  Assessment     ICD-10-CM   1. Primary hypertension  I10 EKG 12-Lead    2. Bifascicular block  I45.2     3. Statin myopathy  G72.0    T46.6X5A     4. Hypercholesteremia  E78.00 Lipid Panel With LDL/HDL Ratio       Medications Discontinued During This Encounter  Medication Reason   pravastatin (PRAVACHOL) 20 MG tablet Discontinued by provider     Meds ordered this encounter  Medications   ezetimibe (ZETIA) 10 MG tablet    Sig: Take 1 tablet (10 mg total) by mouth daily.    Dispense:  90 tablet    Refill:  3   Orders Placed This Encounter  Procedures   Lipid Panel With LDL/HDL Ratio  Standing Status:   Future    Standing Expiration Date:   09/22/2022   EKG 12-Lead     Recommendations:   Krystal Hopkins is a  88 y.o. Caucasian female patient with hypertension, stage IIIa chronic kidney disease, hyperlipidemia, prior tobacco use disorder.  Patient has known history of bifascicular block, however office visit she underwent 6-minute walk test and was able to achieve 84% of MPHR with activity and there was no desaturation.  Patient was last seen in the office 09/17/2021 by Dr. Einar Gip at which time patient's symptoms of fatigue and decreased exercise tolerance had significantly improved since discontinuing pravastatin.  Patient was advised not to resume statin therapy and was overall stable from a cardiovascular standpoint.  Patient now presents for 55-month follow-up of hypertension and bifascicular block.  I personally reviewed external labs, lipids are not well controlled.  Patient is unable to tolerate statin therapy.  Therefore shared decision was to start Zetia 10 mg p.o. daily and will repeat lipid profile testing in 3 months.  Blood pressure is well controlled and EKG unchanged compared to previous.  Patient's physical exam remained stable.  In regard to fatigue and mild dyspnea suspect this is multifactorial including deconditioning.  Counseled patient to increase physical activity slowly as tolerated and to notify our office if mild dyspnea and mild fatigue do not improve.  Follow-up in 3 months, sooner if needed, for fatigue and hyperlipidemia.   Alethia Berthold, PA-C 03/23/2022, 11:50 AM Office: [phone]

## 2022-06-25 ENCOUNTER — Ambulatory Visit: Payer: Medicare Other | Admitting: Cardiology

## 2022-06-25 ENCOUNTER — Encounter: Payer: Self-pay | Admitting: Cardiology

## 2022-06-25 VITALS — BP 123/73 | HR 74 | Temp 98.1°F | Resp 16 | Ht 63.0 in | Wt 162.0 lb

## 2022-06-25 DIAGNOSIS — N1832 Chronic kidney disease, stage 3b: Secondary | ICD-10-CM

## 2022-06-25 DIAGNOSIS — I452 Bifascicular block: Secondary | ICD-10-CM

## 2022-06-25 DIAGNOSIS — I10 Essential (primary) hypertension: Secondary | ICD-10-CM

## 2022-06-25 DIAGNOSIS — M159 Polyosteoarthritis, unspecified: Secondary | ICD-10-CM

## 2022-06-25 MED ORDER — TRAMADOL HCL 50 MG PO TABS: 50.0000 mg | ORAL_TABLET | Freq: Every evening | ORAL | 0 refills | Status: AC | PRN

## 2022-06-25 MED ORDER — GABAPENTIN 100 MG PO CAPS: 100.0000 mg | ORAL_CAPSULE | Freq: Three times a day (TID) | ORAL | 1 refills | Status: DC

## 2022-06-25 NOTE — Progress Notes (Signed)
Primary Physician/Referring:  Iona Hansen, NP  Patient ID: Krystal Hopkins, female    DOB: October 21, 1931, 88 y.o.   MRN: [id number]  No chief complaint on file.   HPI:    Krystal Hopkins  is a 88 y.o. Caucasian female patient with hypertension, stage IIIa chronic kidney disease, hyperlipidemia, prior tobacco use disorder.  Patient has known history of bifascicular block, however office visit she underwent 6-minute walk test and was able to achieve 84% of MPHR with activity and there was no desaturation.  Patient's main complaint is generalized body ache, severe pain in all the joints, she went has very poor sleeping habits as she cannot even turn in the bed without pain.  Denies chest pain, orthopnea, PND, leg edema.  No dizziness or syncope.  Past Medical History:  Diagnosis Date   Blood transfusion without reported diagnosis    Cataract    Colon polyp 2014   TUBULAR ADENOMA.   Hyperlipidemia    Hypertension    Hypothyroidism    Osteoporosis    Past Surgical History:  Procedure Laterality Date   ABDOMINAL HYSTERECTOMY     uterus only removed   APPENDECTOMY     CATARACT EXTRACTION Bilateral    COLONOSCOPY  2014   TONSILLECTOMY AND ADENOIDECTOMY     Family History  Problem Relation Age of Onset   Heart attack Mother 25   Heart disease Mother    Heart attack Father 39   Heart disease Father    Heart failure Sister 4   Heart failure Sister 7   Congestive Heart Failure Sister    Heart attack Brother 55   Stroke Brother    Heart disease Other        siblings   Colon cancer Neg Hx    Esophageal cancer Neg Hx    Rectal cancer Neg Hx    Stomach cancer Neg Hx     Social History   Tobacco Use   Smoking status: Former    Packs/day: 1.00    Years: 20.00    Total pack years: 20.00    Types: Cigarettes    Quit date: 10/24/1970    Years since quitting: 51.7   Smokeless tobacco: Never  Substance Use Topics   Alcohol use: Yes    Comment: socially   Marital  Status: Married  ROS  Review of Systems  Cardiovascular:  Negative for chest pain, dyspnea on exertion and leg swelling.  Musculoskeletal:  Positive for arthritis and back pain.   Objective  Blood pressure 123/73, pulse 74, temperature 98.1 F (36.7 C), temperature source Temporal, resp. rate 16, height 5\' 3"  (1.6 m), weight 162 lb (73.5 kg), SpO2 92 %. Body mass index is 28.7 kg/m.     06/25/2022   11:41 AM 03/23/2022   11:19 AM 09/17/2021    1:14 PM  Vitals with BMI  Height 5\' 3"  5\' 3"  5\' 3"   Weight 162 lbs 165 lbs 3 oz 168 lbs  BMI 28.7 29.27 29.77  Systolic 123 112 09/19/2021  Diastolic 73 60 68  Pulse 74 70 76    Physical Exam Vitals reviewed.  Neck:     Vascular: No carotid bruit or JVD.  Cardiovascular:     Rate and Rhythm: Normal rate and regular rhythm.     Pulses: Normal pulses and intact distal pulses.     Heart sounds: Murmur heard.     Early systolic murmur is present with a grade of 2/6 at the upper  right sternal border.     No gallop.  Pulmonary:     Effort: Pulmonary effort is normal.     Breath sounds: Normal breath sounds.  Musculoskeletal:     Right lower leg: No edema.     Left lower leg: No edema.     Laboratory examination:   External labs:  01/23/2022: Sodium 136, potassium 4.5, BUN 49, creatinine 1.24, AST 23, ALT 16, GFR 42 Total cholesterol 273, triglycerides 84, HDL 88, LDL 168 TSH 5.59 Hgb 12.6, HCT 38.7, MCV 92.7, platelet 394  Labs 07/24/2021:  BUN 52, creatinine 1.29, EGFR 40 mL, potassium 5.0, CMP otherwise normal.  Hb 12.2/HCT 36.8, platelets 345.  Normal indicis.  TSH normal at 3.70.  Total cholesterol 183, triglycerides 73, HDL 80, LDL 88.  Allergies   Allergies  Allergen Reactions   Formaldehyde Swelling   Fosamax [Alendronate Sodium] Other (See Comments)    "muscle aches"      Medication list after today's encounter    Current Outpatient Medications:    acetaminophen (TYLENOL) 650 MG CR tablet, Take 650 mg by mouth  every 8 (eight) hours as needed for pain., Disp: , Rfl:    alendronate (FOSAMAX) 70 MG tablet, Take 70 mg by mouth once a week. Take with a full glass of water on an empty stomach., Disp: , Rfl:    aspirin EC 81 MG tablet, Take 81 mg by mouth every morning., Disp: , Rfl:    Coenzyme Q10 (COQ-10) 100 MG capsule, Take 100 mg by mouth daily., Disp: , Rfl:    diclofenac (VOLTAREN) 0.1 % ophthalmic solution, 4 (four) times daily., Disp: , Rfl:    gabapentin (NEURONTIN) 100 MG capsule, Take 1 capsule (100 mg total) by mouth 3 (three) times daily., Disp: 60 capsule, Rfl: 1   levothyroxine (SYNTHROID, LEVOTHROID) 100 MCG tablet, Take 100 mcg by mouth every morning., Disp: , Rfl:    Multiple Vitamin (MULITIVITAMIN WITH MINERALS) TABS, Take 1 tablet by mouth every morning., Disp: , Rfl:    Omega-3 Fatty Acids (FISH OIL PO), Take 1 tablet by mouth daily., Disp: , Rfl:    traMADol (ULTRAM) 50 MG tablet, Take 1 tablet (50 mg total) by mouth at bedtime as needed., Disp: 30 tablet, Rfl: 0   vitamin C (ASCORBIC ACID) 500 MG tablet, Take 500 mg by mouth daily. Reported on 01/20/2016, Disp: , Rfl:    vitamin C (ASCORBIC ACID) 500 MG tablet, Take 500 mg by mouth daily., Disp: , Rfl:    VITAMIN D, CHOLECALCIFEROL, PO, Take 1 tablet by mouth daily., Disp: , Rfl:    Radiology:   CT scan of the chest without contrast 12/30/2012: No evidence of interstitial lung disease.  No findings to explain  the patient's cough and shortness of breath.   Cardiac Studies:   Cardiopulmonary stress test 01/26/2013: Exercise testing with gas exchange demonstrates a normal functional capacity when compared to matched sedentary  norms. There does not appear to be a clear ventilatory limitations. There appears to be mild circulatory limitation in  terms of diastolic dysfunction. IN addition her body habitus, deconditioning, and age-related decline may all be contributing to her exercise intolerance.   ABI 08/10/2013: Bilateral: ABI is  within normal limits with abnormal Doppler  waveforms noted in the anterior tibial artery.    Carotid artery duplex  37/85/8850: Peak systolic velocities in the right bifurcation, internal, external and common carotid arteries are within normal limits. Minimal stenosis in the left internal carotid artery (1-15%).  Antegrade right vertebral artery flow. Antegrade left vertebral artery flow  PCV MYOCARDIAL PERFUSION WITH LEXISCAN 01/26/2020 No previous exam available for comparison. Lexiscan nuclear stress test performed using 1-day protocol. Stress EKG is non-diagnostic, as this is pharmacological stress test. In addition, stress EKG at 84% MPHR showed sinus tachycardia, RBBB, LAFB, no ischemic changes. Normal myocardial perfusion imaging. Stress LVEF 75%. Low risk study.  PCV ECHOCARDIOGRAM COMPLETE 08/21/2021 Left ventricle cavity is normal in size. Focal basal septal hypertrophy. Normal LV systolic function with EF 59%. Normal global wall motion. Doppler evidence of grade I (impaired) diastolic dysfunction, normal LAP. Severe tricuspid regurgitation. Peak RA-RV gradient 13 mmHg. IVC not seen. Previous study in 02/2020 reported mild to moderate TR.   EKG:  03/23/2022: Sinus rhythm with first-degree AV block at a rate of 62 bpm.  Left axis, left anterior fascicular block.  Incomplete right bundle branch block.  Bifascicular block.  No evidence of ischemia or underlying injury pattern.  Compared to EKG 08/18/2021, no significant change.  Assessment     ICD-10-CM   1. Primary hypertension  I10     2. Bifascicular block  I45.2     3. Stage 3b chronic kidney disease (HCC)  N18.32     4. Osteoarthritis involving multiple joints on both sides of body  M15.9 gabapentin (NEURONTIN) 100 MG capsule    traMADol (ULTRAM) 50 MG tablet       Medications Discontinued During This Encounter  Medication Reason   ezetimibe (ZETIA) 10 MG tablet    olmesartan (BENICAR) 20 MG tablet      Meds  ordered this encounter  Medications   gabapentin (NEURONTIN) 100 MG capsule    Sig: Take 1 capsule (100 mg total) by mouth 3 (three) times daily.    Dispense:  60 capsule    Refill:  1    Refills to Eldridge Abrahams, NP   traMADol (ULTRAM) 50 MG tablet    Sig: Take 1 tablet (50 mg total) by mouth at bedtime as needed.    Dispense:  30 tablet    Refill:  0    Refills to Eldridge Abrahams, NP   No orders of the defined types were placed in this encounter.    Recommendations:   Krystal Hopkins is a 88 y.o. Caucasian female patient with hypertension, stage IIIa chronic kidney disease, hyperlipidemia, prior tobacco use disorder.  Patient has known history of bifascicular block, however office visit she underwent 6-minute walk test and was able to achieve 84% of MPHR with activity and there was no desaturation.  Patient's main complaint is generalized body ache, severe pain in all the joints, she went has very poor sleeping habits as she cannot even turn in the bed without pain.  At this point, patient being 88 years of age, main goal of therapy would be patient's comfort.  I would not worry about her lipids, would address her pain situation.  I have prescribed her Neurontin 100 mg 2 times a day, she could also try Neurontin only 1 tablet once a day in the morning or she can take 1 tablet in the morning and 1 tablet in the afternoon, see how she does with that and also for the evening.  I have prescribed her tramadol as she has severe pain even turning in the bed.  She can request refills with her PCP if she does tolerate the medications.  Could also consider evaluation for osteoporosis.  With regard to cardiac status, she remains asymptomatic without  dizziness or syncope, blood pressure is well controlled and heart rate is normal.  Hence I will see her back on a as needed basis.   Adrian Prows, PA-C 06/25/2022, 12:46 PM Office: [phone]

## 2023-07-10 ENCOUNTER — Other Ambulatory Visit: Payer: Self-pay | Admitting: Cardiology

## 2023-07-10 DIAGNOSIS — M159 Polyosteoarthritis, unspecified: Secondary | ICD-10-CM

## 2023-09-01 ENCOUNTER — Ambulatory Visit: Payer: Medicare Other | Attending: Cardiology | Admitting: Cardiology

## 2023-09-01 ENCOUNTER — Encounter: Payer: Self-pay | Admitting: Cardiology

## 2023-09-01 VITALS — BP 110/64 | HR 62 | Ht 62.0 in | Wt 142.4 lb

## 2023-09-01 DIAGNOSIS — I10 Essential (primary) hypertension: Secondary | ICD-10-CM

## 2023-09-01 DIAGNOSIS — I452 Bifascicular block: Secondary | ICD-10-CM | POA: Diagnosis not present

## 2023-09-01 DIAGNOSIS — E7800 Pure hypercholesterolemia, unspecified: Secondary | ICD-10-CM

## 2023-09-01 DIAGNOSIS — N1832 Chronic kidney disease, stage 3b: Secondary | ICD-10-CM

## 2023-09-01 NOTE — Patient Instructions (Signed)

## 2023-09-01 NOTE — Progress Notes (Signed)
Cardiology Office Note:  .   Date:  09/01/2023  ID:  Krystal Hopkins, DOB Jan 19, 1932, MRN [id number] PCP: Iona Hansen, NP  [facility] Providers Cardiologist:  Yates Decamp, MD   History of Present Illness: .   Krystal Hopkins is a 89 y.o. Caucasian female patient with hypertension, stage IIIa chronic kidney disease, hyperlipidemia, prior tobacco use disorder. Patient has known history of bifascicular block   Discussed the use of AI scribe software for clinical note transcription with the patient, who gave verbal consent to proceed.  History of Present Illness   The patient, with a history of hypertension, hyperlipidemia, and minor EKG changes, presents for an annual check-up. She reports significant improvement in her overall health compared to the previous year when she was experiencing severe pain that disrupted her sleep. She attributes this improvement to a change in her cholesterol medication, specifically the initiation of ezetimibe and discontinuation of statins. She denies any current complaints.  The patient lives with her daughter and is independent in her activities of daily living. She has a family history of heart disease and is proactive in her cardiovascular health, seeking annual check-ups despite feeling well.        Review of Systems  Cardiovascular:  Negative for chest pain, dyspnea on exertion and leg swelling.    Labs   External Labs:  Labs 07/25/2023:  Sodium 135, potassium 5.0, BUN 38, creatinine 1.16, EGFR 45 mL.  LFTs normal.  TSH normal at 3.041.  Labs 01/26/2023:  Total cholesterol 221, triglycerides 108, HDL 70, LDL 129.  Non-HDL cholesterol 151.  Vitamin D69.1.  Physical Exam:   VS:  BP 110/64   Pulse 62   Ht 5\' 2"  (1.575 m)   Wt 142 lb 6.4 oz (64.6 kg)   SpO2 98%   BMI 26.05 kg/m    Wt Readings from Last 3 Encounters:  09/01/23 142 lb 6.4 oz (64.6 kg)  06/25/22 162 lb (73.5 kg)  03/23/22 165 lb 3.2 oz (74.9 kg)      Physical Exam Neck:     Vascular: No carotid bruit or JVD.  Cardiovascular:     Rate and Rhythm: Normal rate and regular rhythm.     Pulses: Intact distal pulses.          Dorsalis pedis pulses are 1+ on the right side and 1+ on the left side.       Posterior tibial pulses are 1+ on the right side and 1+ on the left side.     Heart sounds: Normal heart sounds. No murmur heard.    No gallop.  Pulmonary:     Effort: Pulmonary effort is normal.     Breath sounds: Normal breath sounds.  Abdominal:     General: Bowel sounds are normal.     Palpations: Abdomen is soft.  Musculoskeletal:     Right lower leg: No edema.     Left lower leg: No edema.     Studies Reviewed: Marland Kitchen    [facility] ECHOCARDIOGRAM COMPLETE 08/21/2021 Left ventricle cavity is normal in size. Focal basal septal hypertrophy. Normal LV systolic function with EF 59%. Normal global wall motion. Doppler evidence of grade I (impaired) diastolic dysfunction, normal LAP. Severe tricuspid regurgitation. Peak RA-RV gradient 13 mmHg. IVC not seen. Previous study in 02/2020 reported mild to moderate TR  EKG:    EKG Interpretation Date/Time:  Wednesday September 01 2023 08:32:17 EST Ventricular Rate:  62 PR Interval:  228 QRS Duration:  126 QT Interval:  418 QTC Calculation: 424 R Axis:   -74  Text Interpretation: EKG 09/01/2023: Sinus rhythm with first-degree block at the rate of 62 bpm, left anterior fascicular block.  Incomplete right bundle branch block.  No evidence of ischemia.  No significant change from 01/19/2012. Confirmed by Delrae Rend [phone]) on 09/01/2023 8:38:48 AM    03/23/2022: Sinus rhythm with first-degree AV block at a rate of 62 bpm. Left axis, left anterior fascicular block. Incomplete right bundle branch block. Bifascicular block. No evidence of ischemia or underlying injury pattern.   Medications and allergies    Allergies  Allergen Reactions   Formaldehyde Swelling   Fosamax [Alendronate Sodium]  Other (See Comments)    "muscle aches"     Current Outpatient Medications:    acetaminophen (TYLENOL) 650 MG CR tablet, Take 650 mg by mouth every 8 (eight) hours as needed for pain., Disp: , Rfl:    alendronate (FOSAMAX) 70 MG tablet, Take 70 mg by mouth once a week. Take with a full glass of water on an empty stomach., Disp: , Rfl:    aspirin EC 81 MG tablet, Take 81 mg by mouth every morning., Disp: , Rfl:    Coenzyme Q10 (COQ-10) 100 MG capsule, Take 100 mg by mouth daily., Disp: , Rfl:    diclofenac (VOLTAREN) 0.1 % ophthalmic solution, 4 (four) times daily., Disp: , Rfl:    Ergocalciferol 10 MCG (400 UNIT) TABS, Take 1 capsule by mouth daily., Disp: , Rfl:    ezetimibe (ZETIA) 10 MG tablet, Take 10 mg by mouth daily., Disp: , Rfl:    levothyroxine (SYNTHROID, LEVOTHROID) 100 MCG tablet, Take 100 mcg by mouth every morning., Disp: , Rfl:    Multiple Vitamin (MULITIVITAMIN WITH MINERALS) TABS, Take 1 tablet by mouth every morning., Disp: , Rfl:    olmesartan (BENICAR) 20 MG tablet, Take 20 mg by mouth daily., Disp: , Rfl:    Omega-3 Fatty Acids (FISH OIL PO), Take 1 tablet by mouth daily., Disp: , Rfl:    traMADol (ULTRAM) 50 MG tablet, Take 1 tablet (50 mg total) by mouth at bedtime as needed., Disp: 30 tablet, Rfl: 0   vitamin C (ASCORBIC ACID) 500 MG tablet, Take 500 mg by mouth daily., Disp: , Rfl:    VITAMIN D, CHOLECALCIFEROL, PO, Take 1 tablet by mouth daily., Disp: , Rfl:    ASSESSMENT AND PLAN: .      ICD-10-CM   1. Primary hypertension  I10 EKG 12-Lead    2. Bifascicular block  I45.2 EKG 12-Lead    CANCELED: EKG 12-Lead    3. Stage 3b chronic kidney disease (HCC)  N18.32 EKG 12-Lead    4. Hypercholesteremia  E78.00      Assessment and Plan    Chronic Pain Significant improvement in pain since last visit. Ezetimibe was started and another cholesterol medication was stopped, which may have contributed to the improvement. -Continue current regimen as it is  effective.  Hypertension Well controlled on Olmesartan. -Continue Olmesartan.  Hyperlipidemia On Ezetimibe.  Given her advanced age, elevated HDL, would not be too aggressive with lipid management, she could not tolerate statins as she did develop severe myalgias and arthralgias which has now resolved.  I will see her back in a year at her request. -Continue Ezetimibe.  Bifascicular Block Stable since 2013, no changes on EKG. -No changes to current management.  General Health Maintenance / Followup Plans -Return for follow-up in one year. -No additional tests needed  at this time.    Signed,  Yates Decamp, MD, [facility] 09/01/2023, 8:51 AM [facility] [address] [place], [place] [address] Phone: [phone]. Fax:  [phone]

## 2024-04-06 ENCOUNTER — Other Ambulatory Visit: Payer: Self-pay | Admitting: Cardiology

## 2024-04-06 DIAGNOSIS — M159 Polyosteoarthritis, unspecified: Secondary | ICD-10-CM

## 2024-04-06 NOTE — Telephone Encounter (Signed)
 Pt of Dr. Ladona. Please advise on this non Cardiac RX.

## 2024-08-28 ENCOUNTER — Ambulatory Visit: Attending: Cardiology | Admitting: Cardiology

## 2024-08-28 NOTE — Progress Notes (Deleted)
  Cardiology Office Note:  .   Date:  08/28/2024  ID:  Krystal Hopkins, DOB 1932-10-04, MRN [id number] PCP: Joshua Santana CROME, NP  [facility] Providers Cardiologist:  Gordy Bergamo, MD { Click to update primary MD,subspecialty MD or APP then REFRESH:1}  History of Present Illness: .   Krystal Hopkins is a 89 y.o. Caucasian female patient with hypertension, stage IIIa-b chronic kidney disease which has remained stable over the past 2 to 3 years, hyperlipidemia, prior tobacco use disorder. Patient has known history of bifascicular block presents for annual visit of hypertension and bifascicular block.     Discussed the use of AI scribe software for clinical note transcription with the patient, who gave verbal consent to proceed.  History of Present Illness   Cardiac Studies relevent.    ECHOCARDIOGRAM COMPLETE 08/21/2021 Left ventricle cavity is normal in size. Focal basal septal hypertrophy. Normal LV systolic function with EF 59%. Normal global wall motion. Doppler evidence of grade I (impaired) diastolic dysfunction, normal LAP. Severe tricuspid regurgitation. Peak RA-RV gradient 13 mmHg. IVC not seen. Previous study in 02/2020 reported mild to moderate TR  Labs   Care everywhere/Faxed External Labs:  Labs 08/18/2024:  TSH normal at 3.350.  Sodium 135, potassium 4.7, BUN 63, creatinine 1.25, eGFR 41 mL.  Hb 12.0/HCT 35.6, platelets 334.  Stable eGFR over the past 2 years.  ROS  ***ROS Physical Exam:   VS:  There were no vitals taken for this visit.   Wt Readings from Last 3 Encounters:  09/01/23 142 lb 6.4 oz (64.6 kg)  06/25/22 162 lb (73.5 kg)  03/23/22 165 lb 3.2 oz (74.9 kg)    BP Readings from Last 3 Encounters:  09/01/23 110/64  06/25/22 123/73  03/23/22 112/60   ***Physical Exam EKG:         ASSESSMENT AND PLAN: .    No diagnosis found. Assessment and Plan Assessment & Plan    Follow up: *** Signed,  Gordy Bergamo, MD, [facility] 08/28/2024,  9:43 AM [facility] [address] [place], [place] [address] Phone: [phone]. Fax:  [phone]

## 2024-10-14 ENCOUNTER — Other Ambulatory Visit: Payer: Self-pay | Admitting: Cardiology

## 2024-10-14 DIAGNOSIS — M159 Polyosteoarthritis, unspecified: Secondary | ICD-10-CM

## 2024-10-16 ENCOUNTER — Other Ambulatory Visit (HOSPITAL_COMMUNITY): Payer: Self-pay | Admitting: *Deleted

## 2024-10-16 ENCOUNTER — Ambulatory Visit: Attending: Cardiology | Admitting: Cardiology

## 2024-10-16 ENCOUNTER — Encounter: Payer: Self-pay | Admitting: Cardiology

## 2024-10-16 VITALS — BP 120/70 | HR 65 | Ht 62.0 in | Wt 146.6 lb

## 2024-10-16 DIAGNOSIS — R0602 Shortness of breath: Secondary | ICD-10-CM

## 2024-10-16 DIAGNOSIS — E7800 Pure hypercholesterolemia, unspecified: Secondary | ICD-10-CM

## 2024-10-16 DIAGNOSIS — I10 Essential (primary) hypertension: Secondary | ICD-10-CM | POA: Diagnosis not present

## 2024-10-16 DIAGNOSIS — I452 Bifascicular block: Secondary | ICD-10-CM | POA: Diagnosis not present

## 2024-10-16 DIAGNOSIS — I071 Rheumatic tricuspid insufficiency: Secondary | ICD-10-CM | POA: Diagnosis not present

## 2024-10-16 DIAGNOSIS — R0609 Other forms of dyspnea: Secondary | ICD-10-CM | POA: Diagnosis not present

## 2024-10-16 MED ORDER — EZETIMIBE 10 MG PO TABS: 10.0000 mg | ORAL_TABLET | Freq: Every day | ORAL | 3 refills | Status: AC

## 2024-10-16 NOTE — Progress Notes (Signed)
 " Cardiology Office Note:  .   Date:  10/16/2024  ID:  EVLYN Hopkins, DOB December 08, 1931, MRN [id number] PCP: Joshua Santana CROME, NP  [facility] Providers Cardiologist:  Gordy Bergamo, MD   History of Present Illness: .   Krystal Hopkins is a 89 y.o.  Caucasian female patient with hypertension, stage IIIa chronic kidney disease, hyperlipidemia, prior tobacco use disorder. Patient has known history of bifascicular block.  I had last seen her 2 years ago.   Echocardiogram on 08/21/2021 revealed normal LVEF with severe tricuspid regurgitation asymptomatic.  I recommended continued observation.  She now presents for follow-up.  Except for decreased exercise tolerance she has no specific complaints.  Denies dyspnea or chest pain, no palpitations, no syncope.    Discussed the use of AI scribe software for clinical note transcription with the patient, who gave verbal consent to proceed.  History of Present Illness Krystal Hopkins is a 89 year old female with bifascicular block who presents with fatigue and a recent episode of chest discomfort.  She reports progressive decrease in energy over time, with fatigue during activities such as house cleaning but not at rest. She denies shortness of breath at rest or presyncope.  About six weeks ago she had a single episode of chest discomfort while at a diner, described as a general feeling of not being right with associated nausea and loss of appetite, without vomiting. Symptoms started before eating, lasted a few minutes, and fully resolved. There has been no recurrence.  She has bifascicular block, hypertension, and hyperlipidemia. She takes olmesartan (Benicar) 20 mg daily and ezetimibe . Her daughter reports her blood pressure is usually about 105/90. She denies current chest pain, shortness of breath at rest, dizziness, or syncope, but notes fatigue with exertion.  Cardiac Studies relevent.    Cardiac Studies & Procedures   Lexiscan Tetrofosmin  stress test 01/26/2020: No previous exam available for comparison. Lexiscan nuclear stress test performed using 1-day protocol. Stress EKG is non-diagnostic, as this is pharmacological stress test. In addition, stress EKG at 84% MPHR showed sinus tachycardia, RBBB, LAFB, no ischemic changes. Normal myocardial perfusion imaging. Stress LVEF 75%. Low risk study.   ECHOCARDIOGRAM COMPLETE 08/21/2021 Left ventricle cavity is normal in size. Focal basal septal hypertrophy. Normal LV systolic function with EF 59%. Normal global wall motion. Doppler evidence of grade I (impaired) diastolic dysfunction, normal LAP. Severe tricuspid regurgitation. Peak RA-RV gradient 13 mmHg. IVC not seen. Previous study in 02/2020 reported mild to moderate TR.  ______________________________________________________________________________________________    EKG:   EKG Interpretation Date/Time:  Monday October 16 2024 14:03:18 EST Ventricular Rate:  64 PR Interval:  220 QRS Duration:  110 QT Interval:  400 QTC Calculation: 412 R Axis:   -51  Text Interpretation: EKG 10/16/2024: Sinus rhythm with first-degree block at rate of 64 bpm, left anterior fascicular block.  Incomplete right bundle branch block.  Poor R wave progression, probably normal variant related to LAFB however cannot exclude anteroseptal infarct old.  Compared to 09/01/2023, no change. Confirmed by Luv Mish, Jagadeesh [phone]) on 10/16/2024 2:20:06 PM  Labs   Care everywhere/Faxed External Labs:  Labs 08/18/2024:  TSH normal at 3.350.  BUN 63, creatinine 1.25, eGFR 41 mL, potassium 4.7.  Hb 12.0/HCT 35.6, platelets 334.  Labs 02/16/2024:  Total cholesterol 214, triglycerides 95, HDL 72, LDL 122.  Vitamin D  63.6.  BUN 44, creatinine 1.14, eGFR 46 mL.  Hb 11.9/HCT 35.4, platelets 246.  ROS  Review of Systems  Constitutional: Positive for malaise/fatigue.  Cardiovascular:  Positive for dyspnea on exertion. Negative for chest pain and leg  swelling.   Physical Exam:   VS:  BP 120/70   Pulse 65   Ht 5' 2 (1.575 m)   Wt 146 lb 9.6 oz (66.5 kg)   SpO2 95%   BMI 26.81 kg/m    Wt Readings from Last 3 Encounters:  10/16/24 146 lb 9.6 oz (66.5 kg)  09/01/23 142 lb 6.4 oz (64.6 kg)  06/25/22 162 lb (73.5 kg)    BP Readings from Last 3 Encounters:  10/16/24 120/70  09/01/23 110/64  06/25/22 123/73   Physical Exam Neck:     Vascular: No JVD.  Cardiovascular:     Rate and Rhythm: Normal rate and regular rhythm.     Pulses: Intact distal pulses.     Heart sounds: S1 normal and S2 normal. Murmur heard.     Midsystolic murmur is present with a grade of 2/6 at the upper right sternal border and lower right sternal border.     No gallop.  Pulmonary:     Effort: Pulmonary effort is normal.     Breath sounds: Normal breath sounds.  Abdominal:     General: Bowel sounds are normal.     Palpations: Abdomen is soft.  Musculoskeletal:     Right lower leg: No edema.     Left lower leg: No edema.    ASSESSMENT AND PLAN: .      ICD-10-CM   1. Primary hypertension  I10 EKG 12-Lead    2. Bifascicular block  I45.2 EKG 12-Lead    ECHOCARDIOGRAM COMPLETE    3. Dyspnea on exertion  R06.09 AMB referral to Pulmonary Rehabilitation Maintenance Program ([facility] Only)    ECHOCARDIOGRAM COMPLETE    4. Severe tricuspid regurgitation  I07.1 EKG 12-Lead    AMB referral to Pulmonary Rehabilitation Maintenance Program ([facility] Only)    ECHOCARDIOGRAM COMPLETE    5. Hypercholesteremia  E78.00 ezetimibe  (ZETIA ) 10 MG tablet     Assessment & Plan Dyspnea on exertion and age-related deconditioning Reports decreased energy and fatigue with exertion, likely due to age-related deconditioning. Heart rate increases appropriately with exertion, indicating adequate cardiac response. No evidence of heart failure or significant cardiac dysfunction. - Referred to pulmonary rehabilitation for exercise conditioning - Ordered echocardiogram to assess cardiac  function and tricuspid valve status - Deconditioning may be playing a role.  Bifascicular block Present, but heart rate increases appropriately with exertion, indicating no immediate need for a pacemaker. No clinical evidence of high-degree AV block or heart failure. - Continue monitoring heart rate and symptoms - I made the patient walk in the hallway, heart rate increased from 65-70 BPM to 84 bpm with activity.  Severe tricuspid regurgitation Noted, but asymptomatic. Echocardiogram will assess for any changes since 2022. - Ordered echocardiogram to evaluate tricuspid valve function as to the etiology for dyspnea.  Primary hypertension Blood pressure is well controlled at 120/70 mmHg with current medication regimen. No changes in management are necessary. - Continue current antihypertensive medication regimen - Presently on olmesartan 20 mg daily.  Hypercholesterolemia Cholesterol levels are slightly elevated, but LDL is 122 mg/dL and HDL is 72 mg/dL. No coronary artery disease present, so current management is adequate. - Continue Zetia  10 mg for cholesterol management regimen.  She is presently 89 years of age with no known vascular disease.   Follow up: 1 Year for dyspnea, Bifascilar block, Hypertension unless echocardiogram is markedly abnormal.  Signed,  Gordy Bergamo, MD, [facility] 10/16/2024, 6:05 PM [facility] [address] [place], [place] [address] Phone: [phone]. Fax:  [phone]  "

## 2024-10-16 NOTE — Patient Instructions (Signed)
 Medication Instructions:  Ezetimibe  (Zetia ) 10 mg daily *If you need a refill on your cardiac medications before your next appointment, please call your pharmacy*  Lab Work: None ordered If you have labs (blood work) drawn today and your tests are completely normal, you will receive your results only by: MyChart Message (if you have MyChart) OR A paper copy in the mail If you have any lab test that is abnormal or we need to change your treatment, we will call you to review the results.  Testing/Procedures: Your physician has requested that you have an echocardiogram. Echocardiography is a painless test that uses sound waves to create images of your heart. It provides your doctor with information about the size and shape of your heart and how well your hearts chambers and valves are working. This procedure takes approximately one hour. There are no restrictions for this procedure. Please do NOT wear cologne, perfume, aftershave, or lotions (deodorant is allowed). Please arrive 15 minutes prior to your appointment time.  Please note: We ask at that you not bring children with you during ultrasound (echo/ vascular) testing. Due to room size and safety concerns, children are not allowed in the ultrasound rooms during exams. Our front office staff cannot provide observation of children in our lobby area while testing is being conducted. An adult accompanying a patient to their appointment will only be allowed in the ultrasound room at the discretion of the ultrasound technician under special circumstances. We apologize for any inconvenience.   Follow-Up: At [facility], you and your health needs are our priority.  As part of our continuing mission to provide you with exceptional heart care, our providers are all part of one team.  This team includes your primary Cardiologist (physician) and Advanced Practice Providers or APPs (Physician Assistants and Nurse Practitioners) who all work  together to provide you with the care you need, when you need it.  Your next appointment:   1 year(s)  Provider:   Gordy Bergamo, MD    We recommend signing up for the patient portal called MyChart.  Sign up information is provided on this After Visit Summary.  MyChart is used to connect with patients for Virtual Visits (Telemedicine).  Patients are able to view lab/test results, encounter notes, upcoming appointments, etc.  Non-urgent messages can be sent to your provider as well.   To learn more about what you can do with MyChart, go to [email or web address].

## 2024-10-18 ENCOUNTER — Ambulatory Visit (HOSPITAL_COMMUNITY)
Admission: RE | Admit: 2024-10-18 | Discharge: 2024-10-18 | Disposition: A | Discharge status: Home / Self Care | Source: Ambulatory Visit | Attending: Cardiology | Admitting: Cardiology

## 2024-10-18 ENCOUNTER — Encounter (HOSPITAL_COMMUNITY): Payer: Self-pay

## 2024-10-18 ENCOUNTER — Telehealth (HOSPITAL_COMMUNITY): Payer: Self-pay

## 2024-10-18 ENCOUNTER — Ambulatory Visit: Payer: Self-pay | Admitting: Cardiology

## 2024-10-18 DIAGNOSIS — I071 Rheumatic tricuspid insufficiency: Secondary | ICD-10-CM | POA: Insufficient documentation

## 2024-10-18 DIAGNOSIS — I452 Bifascicular block: Secondary | ICD-10-CM | POA: Diagnosis present

## 2024-10-18 DIAGNOSIS — R0609 Other forms of dyspnea: Secondary | ICD-10-CM | POA: Insufficient documentation

## 2024-10-18 LAB — ECHOCARDIOGRAM COMPLETE
AR max vel: 1.4 cm2
AV Area VTI: 1.29 cm2
AV Area mean vel: 1.48 cm2
AV Mean grad: 11 mmHg
AV Peak grad: 20.1 mmHg
Ao pk vel: 2.24 m/s
Area-P 1/2: 2.68 cm2
S' Lateral: 2.8 cm

## 2024-10-18 NOTE — Telephone Encounter (Signed)
 Attempted to call patient in regards to Pulmonary Rehab - LM on VM   Mailed letter

## 2024-10-18 NOTE — Telephone Encounter (Signed)
 Office referral recv'ed, printed.

## 2024-10-18 NOTE — Progress Notes (Signed)
 Normal heart function, very mild aortic stenosis, no further evaluation is indicated with regard to aortic stenosis.  Aortic valve is between the main chamber of the heart and the aorta. Its job is to prevent blood leaking back into the heart when the heart has finished pumping the blood out and is relaxing.  AS means Aortic stenosis, narrowing on the aortic valve. it can be mild, moderate or severe. Valve replacement needed only if severe AS. Otherwise simply to be monitored  by doctors or serial echocardiograms depending upon clinical situation.

## 2024-10-19 ENCOUNTER — Telehealth (HOSPITAL_COMMUNITY): Payer: Self-pay

## 2024-10-19 NOTE — Telephone Encounter (Signed)
 Patient called and was interested in participating in the Pulmonary Rehab Program. Patient will come in for orientation on 2/2@1  and will attend the 1:15 exercise class.   Pensions consultant.

## 2024-10-19 NOTE — Telephone Encounter (Signed)
 Pt insurance is active and benefits verified through [facility] Medicare Co-pay $20, DED 0/0 met, out of pocket $4,200/0 met, co-insurance 0%. no pre-authorization required, John/[employer] 10/19/24@11 , REF# [id number]  No visit limits for pulmonary rehab

## 2024-11-03 ENCOUNTER — Telehealth (HOSPITAL_COMMUNITY): Payer: Self-pay

## 2024-11-03 NOTE — Telephone Encounter (Signed)
 Called pt to confirm PR orientation for 11/06/24. No answer. LVM.

## 2024-11-06 ENCOUNTER — Encounter (HOSPITAL_COMMUNITY)

## 2024-11-06 ENCOUNTER — Telehealth (HOSPITAL_COMMUNITY): Payer: Self-pay

## 2024-11-06 NOTE — Telephone Encounter (Signed)
 Called pt to reschedule PR orientation appt. Unable to leave voicemail.

## 2024-11-07 ENCOUNTER — Telehealth (HOSPITAL_COMMUNITY): Payer: Self-pay

## 2024-11-07 NOTE — Telephone Encounter (Signed)
 Called patient to reschedule her Pulmonary rehab orientation. Patient will come in for orientation on 11/15/24@ 1030 and will attend the 1:15 exercise class.  Pensions consultant.

## 2024-11-14 ENCOUNTER — Encounter (HOSPITAL_COMMUNITY)

## 2024-11-16 ENCOUNTER — Encounter (HOSPITAL_COMMUNITY)

## 2024-11-21 ENCOUNTER — Encounter (HOSPITAL_COMMUNITY)

## 2024-11-23 ENCOUNTER — Encounter (HOSPITAL_COMMUNITY)

## 2024-11-28 ENCOUNTER — Encounter (HOSPITAL_COMMUNITY)

## 2024-11-30 ENCOUNTER — Encounter (HOSPITAL_COMMUNITY)

## 2024-12-05 ENCOUNTER — Encounter (HOSPITAL_COMMUNITY)

## 2024-12-07 ENCOUNTER — Encounter (HOSPITAL_COMMUNITY)

## 2024-12-12 ENCOUNTER — Encounter (HOSPITAL_COMMUNITY)

## 2024-12-14 ENCOUNTER — Encounter (HOSPITAL_COMMUNITY)

## 2024-12-19 ENCOUNTER — Encounter (HOSPITAL_COMMUNITY)

## 2024-12-21 ENCOUNTER — Encounter (HOSPITAL_COMMUNITY)

## 2024-12-26 ENCOUNTER — Encounter (HOSPITAL_COMMUNITY)

## 2024-12-28 ENCOUNTER — Encounter (HOSPITAL_COMMUNITY)

## 2025-01-02 ENCOUNTER — Encounter (HOSPITAL_COMMUNITY)

## 2025-01-04 ENCOUNTER — Encounter (HOSPITAL_COMMUNITY)

## 2025-01-09 ENCOUNTER — Encounter (HOSPITAL_COMMUNITY)

## 2025-01-11 ENCOUNTER — Encounter (HOSPITAL_COMMUNITY)

## 2025-01-16 ENCOUNTER — Encounter (HOSPITAL_COMMUNITY)

## 2025-01-18 ENCOUNTER — Encounter (HOSPITAL_COMMUNITY)

## 2025-01-23 ENCOUNTER — Encounter (HOSPITAL_COMMUNITY)

## 2025-01-25 ENCOUNTER — Encounter (HOSPITAL_COMMUNITY)

## 2025-01-30 ENCOUNTER — Encounter (HOSPITAL_COMMUNITY)
